# Patient Record
Sex: Male | Born: 1980
Health system: Southern US, Community
[De-identification: ages and names within clinical notes are randomized; demographics above are authoritative.]

## PROBLEM LIST (undated history)

## (undated) DIAGNOSIS — R042 Hemoptysis: Secondary | ICD-10-CM

---
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
## 2017-10-07 ENCOUNTER — Ambulatory Visit: Payer: BLUE CROSS/BLUE SHIELD | Admitting: Family Medicine

## 2017-10-07 ENCOUNTER — Encounter: Payer: Self-pay | Admitting: Family Medicine

## 2017-10-07 VITALS — BP 130/86 | HR 77 | Temp 99.1°F | Resp 16 | Ht 69.0 in | Wt 193.6 lb

## 2017-10-07 DIAGNOSIS — Z8719 Personal history of other diseases of the digestive system: Secondary | ICD-10-CM

## 2017-10-07 DIAGNOSIS — D224 Melanocytic nevi of scalp and neck: Secondary | ICD-10-CM | POA: Diagnosis not present

## 2017-10-07 DIAGNOSIS — J019 Acute sinusitis, unspecified: Secondary | ICD-10-CM

## 2017-10-07 DIAGNOSIS — Z23 Encounter for immunization: Secondary | ICD-10-CM

## 2017-10-07 MED ORDER — AMOXICILLIN-POT CLAVULANATE 875-125 MG PO TABS
1.0000 | ORAL_TABLET | Freq: Two times a day (BID) | ORAL | 0 refills | Status: DC
Start: 2017-10-07 — End: 2018-04-13

## 2017-10-07 NOTE — Progress Notes (Signed)
By signing my name below, I, Mayer Masker, attest that this documentation has been prepared under the direction and in the presence of Carlota Raspberry, Ranell Patrick, MD. Electronically Signed: Mayer Masker, Medical Scribe 10/07/2017 at 2:30 PM. Subjective:    Patient ID: Wesley Mcknight, male    DOB: 10-02-1980, 37 y.o.   MRN: [id number]  HPI Chief Complaint  Patient presents with  . new pt  . Lump    back of head on right side, x 2-3 months  . cold    "sitting in right side of face" pt states he feels like it has been going on x 4 months   Wesley Mcknight is a 37 y.o. male is a new pt to me who presents to Primary Care at [facility] complains of a bump on the R/back side of his head for 2-3 months.  It is not changing in size. He has tried to pick at it and scratch. It has not bled. He does not work outside often. He denies pain or drainage to the area.   Cold symptoms: Pt states he had a ruptured tooth on R side of his face, then had a sinus infection as a result. He had this tooth pulled, was on abx, and afterwards he states he has foul tasting and smelling mucus. He has had constant discolored nasal discharge (green in color) and congestion for 4-5 months. He has recently had a lot of dental work.  He denies fever, chills, facial pain, and unexplained wt loss. He denies new dental pain or cold sensitivity. Pt has NKDA to abx.   He has a FMHx of skin cancer (father).  Reflux: Pt has a h/o reflux but is able to manage this with diet and watching what he eats, he is not on meds at this time.    There are no active problems to display for this patient.  History reviewed. No pertinent past medical history. Past Surgical History:  Procedure Laterality Date  . PCL replacement     No Known Allergies Prior to Admission medications   Medication Sig Start Date End Date Taking? Authorizing Provider  amoxicillin-clavulanate (AUGMENTIN) 875-125 MG tablet Take 1 tablet by mouth 2 (two) times  daily. 10/07/17   Wendie Agreste, MD   Social History   Socioeconomic History  . Marital status: Single    Spouse name: Not on file  . Number of children: Not on file  . Years of education: Not on file  . Highest education level: Not on file  Social Needs  . Financial resource strain: Not on file  . Food insecurity - worry: Not on file  . Food insecurity - inability: Not on file  . Transportation needs - medical: Not on file  . Transportation needs - non-medical: Not on file  Occupational History  . Not on file  Tobacco Use  . Smoking status: Never Smoker  . Smokeless tobacco: Never Used  Substance and Sexual Activity  . Alcohol use: Yes    Alcohol/week: 1.8 oz    Types: 3 Standard drinks or equivalent per week  . Drug use: No  . Sexual activity: Not on file  Other Topics Concern  . Not on file  Social History Narrative  . Not on file   Vitals:   10/07/17 1431  BP: 130/86  Pulse: 77  Resp: 16  Temp: 99.1 F (37.3 C)  TempSrc: Oral  SpO2: 97%  Weight: 193 lb 9.6 oz (87.8 kg)  Height: 5\' 9"  (  1.753 m)    Review of Systems  Constitutional: Negative for chills, fever and unexpected weight change.  HENT: Positive for congestion and postnasal drip. Negative for dental problem.   Endocrine: Negative for cold intolerance.  Skin:       Negative for: pain, bleeding, and drainage to the skin      Objective:   Physical Exam  Constitutional: He is oriented to person, place, and time. He appears well-developed and well-nourished.  HENT:  Head: Normocephalic and atraumatic.  Right Ear: Tympanic membrane, external ear and ear canal normal.  Left Ear: Tympanic membrane, external ear and ear canal normal.  Nose: No rhinorrhea.  Mouth/Throat: Oropharynx is clear and moist and mucous membranes are normal. No oropharyngeal exudate or posterior oropharyngeal erythema.  Scalp: Flesh colored lesion at the R occipital-parietal area of the scalp approx 4 mm. It is slightly  elevated, slight waxy appearance.   Mouth: No gum erythema, no apparent abscess, no intraloral lesions. No frontal or maxillary sinus TTP. Minimal edema of turbinates of the nose  Eyes: Conjunctivae are normal. Pupils are equal, round, and reactive to light.  Neck: Neck supple.  Cardiovascular: Normal rate, regular rhythm, normal heart sounds and intact distal pulses.  No murmur heard. Pulmonary/Chest: Effort normal and breath sounds normal. He has no wheezes. He has no rhonchi. He has no rales.  Abdominal: Soft. There is no tenderness.  Lymphadenopathy:    He has no cervical adenopathy.  Neurological: He is alert and oriented to person, place, and time.  Skin: Skin is warm and dry. No rash noted.  Psychiatric: He has a normal mood and affect. His behavior is normal.  Vitals reviewed.     Assessment & Plan:    Wesley Mcknight is a 37 y.o. male Subacute sinusitis, unspecified location - Plan: amoxicillin-clavulanate (AUGMENTIN) 875-125 MG tablet  - Suspected right-sided sinusitis. Could be ethmoid/sphenoid, subacute.  -Augmentin for 10 days, saline nasal spray or an idiopathic, then if not improving in 2 weeks, CT sinuses.  Nevus of scalp - Plan: Ambulatory referral to Dermatology  - Slight waxy appearance, differential includes basal cell versus squamous cell versus benign nevus. Refer to dermatology for evaluation and likely removal  Need for Tdap vaccination - Plan: Tdap vaccine greater than or equal to 7yo IM  Needs flu shot - Plan: Flu Vaccine QUAD 36+ mos IM  History of esophageal reflux  - Trigger avoidance discussed, handout given. notes in the past his symptoms were worse with greasy food or spicy food to where he felt food would try to come back up. Denies recent symptoms.  Meds ordered this encounter  Medications  . amoxicillin-clavulanate (AUGMENTIN) 875-125 MG tablet    Sig: Take 1 tablet by mouth 2 (two) times daily.    Dispense:  20 tablet    Refill:  0    Patient Instructions   Start augmentin for possible sinus infection.  If not improving in next 2 weeks, would recommend CT scan of sinuses. Return to the clinic or go to the nearest emergency room if any of your symptoms worsen or new symptoms occur.  I will refer you to dermatology for the area on your scalp. Let me know if there are questions prior to that time.   If any worsening reflux symptoms, or difficulty with food swallowing,  please follow-up to discuss further.   Sinusitis, Adult Sinusitis is soreness and inflammation of your sinuses. Sinuses are hollow spaces in the bones around your face. Your  sinuses are located:  Around your eyes.  In the middle of your forehead.  Behind your nose.  In your cheekbones.  Your sinuses and nasal passages are lined with a stringy fluid (mucus). Mucus normally drains out of your sinuses. When your nasal tissues become inflamed or swollen, the mucus can become trapped or blocked so air cannot flow through your sinuses. This allows bacteria, viruses, and funguses to grow, which leads to infection. Sinusitis can develop quickly and last for 7?10 days (acute) or for more than 12 weeks (chronic). Sinusitis often develops after a cold. What are the causes? This condition is caused by anything that creates swelling in the sinuses or stops mucus from draining, including:  Allergies.  Asthma.  Bacterial or viral infection.  Abnormally shaped bones between the nasal passages.  Nasal growths that contain mucus (nasal polyps).  Narrow sinus openings.  Pollutants, such as chemicals or irritants in the air.  A foreign object stuck in the nose.  A fungal infection. This is rare.  What increases the risk? The following factors may make you more likely to develop this condition:  Having allergies or asthma.  Having had a recent cold or respiratory tract infection.  Having structural deformities or blockages in your nose or  sinuses.  Having a weak immune system.  Doing a lot of swimming or diving.  Overusing nasal sprays.  Smoking.  What are the signs or symptoms? The main symptoms of this condition are pain and a feeling of pressure around the affected sinuses. Other symptoms include:  Upper toothache.  Earache.  Headache.  Bad breath.  Decreased sense of smell and taste.  A cough that may get worse at night.  Fatigue.  Fever.  Thick drainage from your nose. The drainage is often green and it may contain pus (purulent).  Stuffy nose or congestion.  Postnasal drip. This is when extra mucus collects in the throat or back of the nose.  Swelling and warmth over the affected sinuses.  Sore throat.  Sensitivity to light.  How is this diagnosed? This condition is diagnosed based on symptoms, a medical history, and a physical exam. To find out if your condition is acute or chronic, your health care provider may:  Look in your nose for signs of nasal polyps.  Tap over the affected sinus to check for signs of infection.  View the inside of your sinuses using an imaging device that has a light attached (endoscope).  If your health care provider suspects that you have chronic sinusitis, you may also:  Be tested for allergies.  Have a sample of mucus taken from your nose (nasal culture) and checked for bacteria.  Have a mucus sample examined to see if your sinusitis is related to an allergy.  If your sinusitis does not respond to treatment and it lasts longer than 8 weeks, you may have an MRI or CT scan to check your sinuses. These scans also help to determine how severe your infection is. In rare cases, a bone biopsy may be done to rule out more serious types of fungal sinus disease. How is this treated? Treatment for sinusitis depends on the cause and whether your condition is chronic or acute. If a virus is causing your sinusitis, your symptoms will go away on their own within 10  days. You may be given medicines to relieve your symptoms, including:  Topical nasal decongestants. They shrink swollen nasal passages and let mucus drain from your sinuses.  Antihistamines. These  drugs block inflammation that is triggered by allergies. This can help to ease swelling in your nose and sinuses.  Topical nasal corticosteroids. These are nasal sprays that ease inflammation and swelling in your nose and sinuses.  Nasal saline washes. These rinses can help to get rid of thick mucus in your nose.  If your condition is caused by bacteria, you will be given an antibiotic medicine. If your condition is caused by a fungus, you will be given an antifungal medicine. Surgery may be needed to correct underlying conditions, such as narrow nasal passages. Surgery may also be needed to remove polyps. Follow these instructions at home: Medicines  Take, use, or apply over-the-counter and prescription medicines only as told by your health care provider. These may include nasal sprays.  If you were prescribed an antibiotic medicine, take it as told by your health care provider. Do not stop taking the antibiotic even if you start to feel better. Hydrate and Humidify  Drink enough water to keep your urine clear or pale yellow. Staying hydrated will help to thin your mucus.  Use a cool mist humidifier to keep the humidity level in your home above 50%.  Inhale steam for 10-15 minutes, 3-4 times a day or as told by your health care provider. You can do this in the bathroom while a hot shower is running.  Limit your exposure to cool or dry air. Rest  Rest as much as possible.  Sleep with your head raised (elevated).  Make sure to get enough sleep each night. General instructions  Apply a warm, moist washcloth to your face 3-4 times a day or as told by your health care provider. This will help with discomfort.  Wash your hands often with soap and water to reduce your exposure to viruses and  other germs. If soap and water are not available, use hand sanitizer.  Do not smoke. Avoid being around people who are smoking (secondhand smoke).  Keep all follow-up visits as told by your health care provider. This is important. Contact a health care provider if:  You have a fever.  Your symptoms get worse.  Your symptoms do not improve within 10 days. Get help right away if:  You have a severe headache.  You have persistent vomiting.  You have pain or swelling around your face or eyes.  You have vision problems.  You develop confusion.  Your neck is stiff.  You have trouble breathing. This information is not intended to replace advice given to you by your health care provider. Make sure you discuss any questions you have with your health care provider. Document Released: 08/02/2005 Document Revised: 03/28/2016 Document Reviewed: 05/28/2015 Elsevier Interactive Patient Education  2018 [place] for Gastroesophageal Reflux Disease, Adult When you have gastroesophageal reflux disease (GERD), the foods you eat and your eating habits are very important. Choosing the right foods can help ease your discomfort. What guidelines do I need to follow?  Choose fruits, vegetables, whole grains, and low-fat dairy products.  Choose low-fat meat, fish, and poultry.  Limit fats such as oils, salad dressings, butter, nuts, and avocado.  Keep a food diary. This helps you identify foods that cause symptoms.  Avoid foods that cause symptoms. These may be different for everyone.  Eat small meals often instead of 3 large meals a day.  Eat your meals slowly, in a place where you are relaxed.  Limit fried foods.  Cook foods using methods other than frying.  Avoid drinking alcohol.  Avoid drinking large amounts of liquids with your meals.  Avoid bending over or lying down until 2-3 hours after eating. What foods are not recommended? These are some foods and  drinks that may make your symptoms worse: Vegetables Tomatoes. Tomato juice. Tomato and spaghetti sauce. Chili peppers. Onion and garlic. Horseradish. Fruits Oranges, grapefruit, and lemon (fruit and juice). Meats High-fat meats, fish, and poultry. This includes hot dogs, ribs, ham, sausage, salami, and bacon. Dairy Whole milk and chocolate milk. Sour cream. Cream. Butter. Ice cream. Cream cheese. Drinks Coffee and tea. Bubbly (carbonated) drinks or energy drinks. Condiments Hot sauce. Barbecue sauce. Sweets/Desserts Chocolate and cocoa. Donuts. Peppermint and spearmint. Fats and Oils High-fat foods. This includes [place] fries and potato chips. Other Vinegar. Strong spices. This includes black pepper, white pepper, red pepper, cayenne, curry powder, cloves, ginger, and chili powder. The items listed above may not be a complete list of foods and drinks to avoid. Contact your dietitian for more information. This information is not intended to replace advice given to you by your health care provider. Make sure you discuss any questions you have with your health care provider. Document Released: 02/01/2012 Document Revised: 01/08/2016 Document Reviewed: 06/06/2013 Elsevier Interactive Patient Education  2017 [employer].   IF you received an x-ray today, you will receive an invoice from [facility]. Please contact [facility] at [phone] with questions or concerns regarding your invoice.   IF you received labwork today, you will receive an invoice from [facility]. Please contact [facility] at [phone] with questions or concerns regarding your invoice.   Our billing staff will not be able to assist you with questions regarding bills from these companies.  You will be contacted with the lab results as soon as they are available. The fastest way to get your results is to activate your My Chart account. Instructions are located on the last page of this paperwork. If you  have not heard from [place] regarding the results in 2 weeks, please contact this office.      I personally performed the services described in this documentation, which was scribed in my presence. The recorded information has been reviewed and considered for accuracy and completeness, addended by me as needed, and agree with information above.  Signed,   Merri Ray, MD [facility] at [place].  10/07/17 3:35 PM

## 2017-10-07 NOTE — Patient Instructions (Addendum)
Start augmentin for possible sinus infection.  If not improving in next 2 weeks, would recommend CT scan of sinuses. Return to the clinic or go to the nearest emergency room if any of your symptoms worsen or new symptoms occur.  I will refer you to dermatology for the area on your scalp. Let me know if there are questions prior to that time.   If any worsening reflux symptoms, or difficulty with food swallowing,  please follow-up to discuss further.   Sinusitis, Adult Sinusitis is soreness and inflammation of your sinuses. Sinuses are hollow spaces in the bones around your face. Your sinuses are located:  Around your eyes.  In the middle of your forehead.  Behind your nose.  In your cheekbones.  Your sinuses and nasal passages are lined with a stringy fluid (mucus). Mucus normally drains out of your sinuses. When your nasal tissues become inflamed or swollen, the mucus can become trapped or blocked so air cannot flow through your sinuses. This allows bacteria, viruses, and funguses to grow, which leads to infection. Sinusitis can develop quickly and last for 7?10 days (acute) or for more than 12 weeks (chronic). Sinusitis often develops after a cold. What are the causes? This condition is caused by anything that creates swelling in the sinuses or stops mucus from draining, including:  Allergies.  Asthma.  Bacterial or viral infection.  Abnormally shaped bones between the nasal passages.  Nasal growths that contain mucus (nasal polyps).  Narrow sinus openings.  Pollutants, such as chemicals or irritants in the air.  A foreign object stuck in the nose.  A fungal infection. This is rare.  What increases the risk? The following factors may make you more likely to develop this condition:  Having allergies or asthma.  Having had a recent cold or respiratory tract infection.  Having structural deformities or blockages in your nose or sinuses.  Having a weak immune  system.  Doing a lot of swimming or diving.  Overusing nasal sprays.  Smoking.  What are the signs or symptoms? The main symptoms of this condition are pain and a feeling of pressure around the affected sinuses. Other symptoms include:  Upper toothache.  Earache.  Headache.  Bad breath.  Decreased sense of smell and taste.  A cough that may get worse at night.  Fatigue.  Fever.  Thick drainage from your nose. The drainage is often green and it may contain pus (purulent).  Stuffy nose or congestion.  Postnasal drip. This is when extra mucus collects in the throat or back of the nose.  Swelling and warmth over the affected sinuses.  Sore throat.  Sensitivity to light.  How is this diagnosed? This condition is diagnosed based on symptoms, a medical history, and a physical exam. To find out if your condition is acute or chronic, your health care provider may:  Look in your nose for signs of nasal polyps.  Tap over the affected sinus to check for signs of infection.  View the inside of your sinuses using an imaging device that has a light attached (endoscope).  If your health care provider suspects that you have chronic sinusitis, you may also:  Be tested for allergies.  Have a sample of mucus taken from your nose (nasal culture) and checked for bacteria.  Have a mucus sample examined to see if your sinusitis is related to an allergy.  If your sinusitis does not respond to treatment and it lasts longer than 8 weeks, you may have  an MRI or CT scan to check your sinuses. These scans also help to determine how severe your infection is. In rare cases, a bone biopsy may be done to rule out more serious types of fungal sinus disease. How is this treated? Treatment for sinusitis depends on the cause and whether your condition is chronic or acute. If a virus is causing your sinusitis, your symptoms will go away on their own within 10 days. You may be given medicines to  relieve your symptoms, including:  Topical nasal decongestants. They shrink swollen nasal passages and let mucus drain from your sinuses.  Antihistamines. These drugs block inflammation that is triggered by allergies. This can help to ease swelling in your nose and sinuses.  Topical nasal corticosteroids. These are nasal sprays that ease inflammation and swelling in your nose and sinuses.  Nasal saline washes. These rinses can help to get rid of thick mucus in your nose.  If your condition is caused by bacteria, you will be given an antibiotic medicine. If your condition is caused by a fungus, you will be given an antifungal medicine. Surgery may be needed to correct underlying conditions, such as narrow nasal passages. Surgery may also be needed to remove polyps. Follow these instructions at home: Medicines  Take, use, or apply over-the-counter and prescription medicines only as told by your health care provider. These may include nasal sprays.  If you were prescribed an antibiotic medicine, take it as told by your health care provider. Do not stop taking the antibiotic even if you start to feel better. Hydrate and Humidify  Drink enough water to keep your urine clear or pale yellow. Staying hydrated will help to thin your mucus.  Use a cool mist humidifier to keep the humidity level in your home above 50%.  Inhale steam for 10-15 minutes, 3-4 times a day or as told by your health care provider. You can do this in the bathroom while a hot shower is running.  Limit your exposure to cool or dry air. Rest  Rest as much as possible.  Sleep with your head raised (elevated).  Make sure to get enough sleep each night. General instructions  Apply a warm, moist washcloth to your face 3-4 times a day or as told by your health care provider. This will help with discomfort.  Wash your hands often with soap and water to reduce your exposure to viruses and other germs. If soap and water are  not available, use hand sanitizer.  Do not smoke. Avoid being around people who are smoking (secondhand smoke).  Keep all follow-up visits as told by your health care provider. This is important. Contact a health care provider if:  You have a fever.  Your symptoms get worse.  Your symptoms do not improve within 10 days. Get help right away if:  You have a severe headache.  You have persistent vomiting.  You have pain or swelling around your face or eyes.  You have vision problems.  You develop confusion.  Your neck is stiff.  You have trouble breathing. This information is not intended to replace advice given to you by your health care provider. Make sure you discuss any questions you have with your health care provider. Document Released: 08/02/2005 Document Revised: 03/28/2016 Document Reviewed: 05/28/2015 Elsevier Interactive Patient Education  2018 [place] for Gastroesophageal Reflux Disease, Adult When you have gastroesophageal reflux disease (GERD), the foods you eat and your eating habits are very important.  Choosing the right foods can help ease your discomfort. What guidelines do I need to follow?  Choose fruits, vegetables, whole grains, and low-fat dairy products.  Choose low-fat meat, fish, and poultry.  Limit fats such as oils, salad dressings, butter, nuts, and avocado.  Keep a food diary. This helps you identify foods that cause symptoms.  Avoid foods that cause symptoms. These may be different for everyone.  Eat small meals often instead of 3 large meals a day.  Eat your meals slowly, in a place where you are relaxed.  Limit fried foods.  Cook foods using methods other than frying.  Avoid drinking alcohol.  Avoid drinking large amounts of liquids with your meals.  Avoid bending over or lying down until 2-3 hours after eating. What foods are not recommended? These are some foods and drinks that may make your symptoms  worse: Vegetables Tomatoes. Tomato juice. Tomato and spaghetti sauce. Chili peppers. Onion and garlic. Horseradish. Fruits Oranges, grapefruit, and lemon (fruit and juice). Meats High-fat meats, fish, and poultry. This includes hot dogs, ribs, ham, sausage, salami, and bacon. Dairy Whole milk and chocolate milk. Sour cream. Cream. Butter. Ice cream. Cream cheese. Drinks Coffee and tea. Bubbly (carbonated) drinks or energy drinks. Condiments Hot sauce. Barbecue sauce. Sweets/Desserts Chocolate and cocoa. Donuts. Peppermint and spearmint. Fats and Oils High-fat foods. This includes [place] fries and potato chips. Other Vinegar. Strong spices. This includes black pepper, white pepper, red pepper, cayenne, curry powder, cloves, ginger, and chili powder. The items listed above may not be a complete list of foods and drinks to avoid. Contact your dietitian for more information. This information is not intended to replace advice given to you by your health care provider. Make sure you discuss any questions you have with your health care provider. Document Released: 02/01/2012 Document Revised: 01/08/2016 Document Reviewed: 06/06/2013 Elsevier Interactive Patient Education  2017 [employer].   IF you received an x-ray today, you will receive an invoice from [facility]. Please contact [facility] at [phone] with questions or concerns regarding your invoice.   IF you received labwork today, you will receive an invoice from [facility]. Please contact [facility] at [phone] with questions or concerns regarding your invoice.   Our billing staff will not be able to assist you with questions regarding bills from these companies.  You will be contacted with the lab results as soon as they are available. The fastest way to get your results is to activate your My Chart account. Instructions are located on the last page of this paperwork. If you have not heard from [place] regarding  the results in 2 weeks, please contact this office.

## 2018-03-05 ENCOUNTER — Encounter (HOSPITAL_COMMUNITY): Payer: Self-pay | Admitting: *Deleted

## 2018-03-05 ENCOUNTER — Emergency Department (HOSPITAL_COMMUNITY): Payer: BLUE CROSS/BLUE SHIELD

## 2018-03-05 ENCOUNTER — Other Ambulatory Visit: Payer: Self-pay

## 2018-03-05 ENCOUNTER — Emergency Department (HOSPITAL_COMMUNITY)
Admission: EM | Admit: 2018-03-05 | Discharge: 2018-03-05 | Disposition: A | Discharge status: Home / Self Care | Payer: BLUE CROSS/BLUE SHIELD | Attending: Emergency Medicine | Admitting: Emergency Medicine

## 2018-03-05 DIAGNOSIS — R079 Chest pain, unspecified: Secondary | ICD-10-CM

## 2018-03-05 DIAGNOSIS — Z79899 Other long term (current) drug therapy: Secondary | ICD-10-CM | POA: Insufficient documentation

## 2018-03-05 DIAGNOSIS — Z8719 Personal history of other diseases of the digestive system: Secondary | ICD-10-CM | POA: Insufficient documentation

## 2018-03-05 DIAGNOSIS — R0789 Other chest pain: Secondary | ICD-10-CM | POA: Diagnosis present

## 2018-03-05 DIAGNOSIS — R05 Cough: Secondary | ICD-10-CM | POA: Insufficient documentation

## 2018-03-05 DIAGNOSIS — R0602 Shortness of breath: Secondary | ICD-10-CM | POA: Diagnosis not present

## 2018-03-05 DIAGNOSIS — R059 Cough, unspecified: Secondary | ICD-10-CM

## 2018-03-05 LAB — BASIC METABOLIC PANEL
Anion gap: 9 (ref 5–15)
BUN: 15 mg/dL (ref 6–20)
CALCIUM: 9.4 mg/dL (ref 8.9–10.3)
CO2: 24 mmol/L (ref 22–32)
Chloride: 109 mmol/L (ref 98–111)
Creatinine, Ser: 0.85 mg/dL (ref 0.61–1.24)
GFR calc Af Amer: >60 mL/min (ref >60)
GFR calc non Af Amer: >60 mL/min (ref >60)
GLUCOSE: 94 mg/dL (ref 70–99)
Potassium: 3.5 mmol/L (ref 3.5–5.1)
Sodium: 142 mmol/L (ref 135–145)

## 2018-03-05 LAB — I-STAT TROPONIN, ED: TROPONIN I, POC: 0 ng/mL (ref 0.00–0.08)

## 2018-03-05 LAB — D-DIMER, QUANTITATIVE: D-Dimer, Quant: <0.27 ug/mL-FEU (ref 0.00–0.50)

## 2018-03-05 LAB — CBC
HCT: 40.3 % (ref 39.0–52.0)
HEMOGLOBIN: 14.3 g/dL (ref 13.0–17.0)
MCH: 31 pg (ref 26.0–34.0)
MCHC: 35.5 g/dL (ref 30.0–36.0)
MCV: 87.4 fL (ref 78.0–100.0)
Platelets: 241 10*3/uL (ref 150–400)
RBC: 4.61 MIL/uL (ref 4.22–5.81)
RDW: 12 % (ref 11.5–15.5)
WBC: 6.7 10*3/uL (ref 4.0–10.5)

## 2018-03-05 MED ORDER — GI COCKTAIL ~~LOC~~
30.0000 mL | Freq: Once | ORAL | Status: AC
  Administered 2018-03-05: 30 mL via ORAL
  Filled 2018-03-05: qty 30

## 2018-03-05 MED ORDER — OMEPRAZOLE 20 MG PO CPDR: 20.0000 mg | DELAYED_RELEASE_CAPSULE | Freq: Every day | ORAL | 1 refills | Status: DC

## 2018-03-05 NOTE — ED Triage Notes (Signed)
Pt arrives with c/o several days of dull chest pain that started to get worse last night. He also reports some dizziness, shortness of breath and coughing up blood.

## 2018-03-05 NOTE — ED Notes (Signed)
Patient transported to X-ray 

## 2018-03-05 NOTE — ED Provider Notes (Signed)
[facility] DEPT Provider Note   CSN: [id number] Arrival date & time: 03/05/18  5993     History   Chief Complaint Chief Complaint  Patient presents with  . Chest Pain    HPI Wesley Mcknight is a 37 y.o. male who presents emergency department today for chest pain.  Patient reports that few weeks ago he had a sinus infection that preceded his symptoms.  He notes that 2 weeks ago he started developing some mild shortness of breath.  He notes that over time this is worsened and now occurs at rest and also with exertion.  He states over the last 2 days he has been having a substernal, dull, achy, pressing chest pain that is constant.  He reports that the pain is pleuritic in nature and worse with deep breaths. The pain is not exertional or positional in nature.  He reports that yesterday he began having a cough with associated blood-streaked sputum x 1.  He denies clots or gross hemoptysis.  No nosebleeds.  He denies any abdominal pain or hematemesis.  Patient is on blood thinners.  He states that he has not had any hemoptysis today. Cough is now productive with clear/white sputum. Patient reports he took 1 baby aspirin a few days ago without any relief.  No other interventions prior to arrival.  He denies history of similar symptoms in the past. Does report history of GERD, and has had some burping and belching associated with this that provides some relief of his symptoms. He notes his nasal congestion and sinus pressure is resolved.  Patient denies any family history of early CAD.  He notes he is a never smoker.  No prior history of MI, CVA or TIA.  He has never had PCI/CABG, echocardiogram or stress testing in the past.  Patient denies any cocaine or other illicit drug use.  He is not on any daily medications.  He does note he drinks approximately 1-3 alcoholic beverages per week.  Patient denies any fever at home. Denies exogenous estrogen/testosterone use, recent  surgery or travel, trauma, immobilization, smoking, previous blood clot,  personal history of cancer, lower extremity pain or swelling, or family/personal history of bleeding/clotting disorder.   HPI  History reviewed. No pertinent past medical history.  There are no active problems to display for this patient.   Past Surgical History:  Procedure Laterality Date  . PCL replacement          Home Medications    Prior to Admission medications   Medication Sig Start Date End Date Taking? Authorizing Provider  amoxicillin-clavulanate (AUGMENTIN) 875-125 MG tablet Take 1 tablet by mouth 2 (two) times daily. 10/07/17   Wendie Agreste, MD    Family History Family History  Problem Relation Age of Onset  . Diabetes Mother     Social History Social History   Tobacco Use  . Smoking status: Never Smoker  . Smokeless tobacco: Never Used  Substance Use Topics  . Alcohol use: Yes    Alcohol/week: 1.8 oz    Types: 3 Standard drinks or equivalent per week  . Drug use: No     Allergies   Patient has no known allergies.   Review of Systems Review of Systems  All other systems reviewed and are negative.    Physical Exam Updated Vital Signs BP 112/79   Pulse 72   Temp 98.1 F (36.7 C) (Oral)   Resp 10   Ht 5\' 9"  (1.753 m)  Wt 89.7 kg (197 lb 12.8 oz)   SpO2 97%   BMI 29.21 kg/m   Physical Exam  Constitutional: He appears well-developed and well-nourished.  HENT:  Head: Normocephalic and atraumatic.  Right Ear: External ear normal.  Left Ear: External ear normal.  Nose: Nose normal. No epistaxis. Right sinus exhibits no maxillary sinus tenderness and no frontal sinus tenderness. Left sinus exhibits no maxillary sinus tenderness and no frontal sinus tenderness.  Mouth/Throat: Uvula is midline, oropharynx is clear and moist and mucous membranes are normal. No tonsillar exudate.  The patient has normal phonation and is in control of secretions. No stridor.  Midline  uvula without edema. Soft palate rises symmetrically.  No tonsillar erythema or exudates. No PTA. Tongue protrusion is normal. No trismus. No creptius on neck palpation and patient has good dentition. No gingival erythema or fluctuance noted. Mucus membranes moist.  Eyes: Pupils are equal, round, and reactive to light. Right eye exhibits no discharge. Left eye exhibits no discharge. No scleral icterus.  Neck: Trachea normal. Neck supple. No JVD present. No spinous process tenderness present. Carotid bruit is not present. No neck rigidity. Normal range of motion present.  No nuchal rigidity or meningismus  Cardiovascular: Normal rate, regular rhythm and intact distal pulses.  No murmur heard. Pulses:      Radial pulses are 2+ on the right side, and 2+ on the left side.       Dorsalis pedis pulses are 2+ on the right side, and 2+ on the left side.       Posterior tibial pulses are 2+ on the right side, and 2+ on the left side.  No lower extremity swelling or edema. Calves symmetric in size bilaterally.  Pulmonary/Chest: Effort normal and breath sounds normal. He exhibits no tenderness.  Patient satting at 97% on room air. No increased work of breathing. No accessory muscle use. Patient is sitting upright, speaking in full sentences without difficulty   Abdominal: Soft. Bowel sounds are normal. There is no tenderness. There is no rebound and no guarding.  Musculoskeletal: He exhibits no edema.  Lymphadenopathy:    He has no cervical adenopathy.  Neurological: He is alert.  Skin: Skin is warm and dry. No petechiae, no purpura and no rash noted. He is not diaphoretic.  Psychiatric: He has a normal mood and affect.  Nursing note and vitals reviewed.    ED Treatments / Results  Labs (all labs ordered are listed, but only abnormal results are displayed) Labs Reviewed  BASIC METABOLIC PANEL  CBC  D-DIMER, QUANTITATIVE (NOT AT [facility])  I-STAT TROPONIN, ED    EKG EKG  Interpretation  Date/Time:  Sunday March 05 2018 06:47:16 EDT Ventricular Rate:  78 PR Interval:    QRS Duration: 87 QT Interval:  366 QTC Calculation: 417 R Axis:   61 Text Interpretation:  Sinus rhythm No previous ECGs available Interpretation limited secondary to artifact Confirmed by Ripley Fraise [phone]) on 03/05/2018 6:58:39 AM   Radiology Dg Chest 2 View  Result Date: 03/05/2018 CLINICAL DATA:  Chest pain and shortness of breath EXAM: CHEST - 2 VIEW COMPARISON:  None. FINDINGS: The heart size and mediastinal contours are within normal limits. Both lungs are clear. The visualized skeletal structures are unremarkable. IMPRESSION: No active cardiopulmonary disease. Electronically Signed   By: Jerilynn Mages.  Shick M.D.   On: 03/05/2018 08:28    Procedures Procedures (including critical care time)  Medications Ordered in ED Medications  gi cocktail (Maalox,Lidocaine,Donnatal) (30 mLs Oral Given  03/05/18 0845)     Initial Impression / Assessment and Plan / ED Course  I have reviewed the triage vital signs and the nursing notes.  Pertinent labs & imaging results that were available during my care of the patient were reviewed by me and considered in my medical decision making (see chart for details).     37 y.o. male presenting today for shortness of breath, substernal chest pressure, burping/belching, cough and one episode of blood-streaked sputum.  Patient is to be discharged with recommendation to follow up with PCP in regards to today's hospital visit. Chest pain is not likely of cardiac or pulmonary etiology due to presentation, negative D-Dimer (patient without tachycardia or hypoxia), stable vital signs, no tracheal deviation, no JVD or new murmur, RRR, breath sounds equal bilaterally, EKG without acute abnormalities, negative troponin, and negative CXR. HEART score is low risk. Patient has been advised to return to the ED if chest pain becomes exertional, associated with diaphoresis  or nausea, radiates to left jaw/arm, worsens or becomes concerning in any way. He is also to return if he develop additional episodes of hemoptysis. There is no associated anemia. Patient was given GI cocktail in department with full relief of symptoms. Patient has history of GERD and is not on anything for this. Will start on omeprazole. Patient appears reliable for follow up and is agreeable to discharge. I advised the patient to follow-up with their primary care provider this week. I advised the patient to return to the emergency department with new or worsening symptoms or new concerns. The patient verbalized understanding and agreement with plan. Patient stable for discharge.   Final Clinical Impressions(s) / ED Diagnoses   Final diagnoses:  Nonspecific chest pain  History of gastroesophageal reflux (GERD)  Shortness of breath  Cough    ED Discharge Orders        Ordered    omeprazole (PRILOSEC) 20 MG capsule  Daily     03/05/18 0937       Jillyn Ledger, PA-C 03/05/18 0941    [name], MD 03/06/18 2024

## 2018-03-05 NOTE — Discharge Instructions (Signed)
Read instructions below for reasons to return to the Emergency Department. It is recommended that your follow up with your Primary Care Doctor in regards to today's visit. If you do not have a doctor, use the resource guide listed below to help you find one.  Begin taking over the counter Prilosec or Zegrid (omeprazole) as directed.   Tests performed today include: An EKG of your heart A chest x-ray Cardiac enzymes - a blood test for heart muscle damage Blood counts and electrolytes D-Dimer. This was within normal limits as we discussed. Vital signs. See below for your results today.   Chest Pain (Nonspecific)  HOME CARE INSTRUCTIONS  For the next few days, avoid physical activities that bring on chest pain. Continue physical activities as directed.  Do not smoke cigarettes or drink alcohol until your symptoms are gone. If you do smoke, it is time to quit. You may receive instructions and counseling on how to stop smoking. Only take over-the-counter or prescription medicine for pain, discomfort, or fever as directed by your caregiver.  Follow your caregiver's suggestions for further testing if your chest pain does not go away.  Keep any follow-up appointments you made. If you do not go to an appointment, you could develop lasting (chronic) problems with pain. If there is any problem keeping an appointment, you must call to reschedule.  SEEK MEDICAL CARE IF:  You think you are having problems from the medicine you are taking. Read your medicine instructions carefully.  Your chest pain does not go away, even after treatment.  You develop a rash with blisters on your chest.  SEEK IMMEDIATE MEDICAL CARE IF:  You have increased chest pain or pain that spreads to your arm, neck, jaw, back, or belly (abdomen).  You develop shortness of breath, an increasing cough, or you are coughing up blood.  You have severe back or abdominal pain, feel sick to your stomach (nauseous) or throw up (vomit).  You  develop severe weakness, fainting, or chills.  You have an oral temperature above 102 F (38.9 C), not controlled by medicine.  THIS IS AN EMERGENCY. Do not wait to see if the pain will go away. Get medical help at once. Call your local emergency services (911 in [place] Do not drive yourself to the hospital. Additional Information:  Your vital signs today were: BP 123/78 (BP Location: Left Arm) Comment: Simultaneous filing. User may not have seen previous data.   Pulse (!) 58 Comment: Simultaneous filing. User may not have seen previous data.   Temp 98.1 F (36.7 C) (Oral)    Resp 10 Comment: Simultaneous filing. User may not have seen previous data.   Ht 5\' 9"  (1.753 m)    Wt 89.7 kg (197 lb 12.8 oz)    SpO2 94% Comment: Simultaneous filing. User may not have seen previous data.   BMI 29.21 kg/m  If your blood pressure (BP) was elevated above 135/85 this visit, please have this repeated by your doctor within one month. ---------------

## 2018-03-17 ENCOUNTER — Ambulatory Visit: Payer: BLUE CROSS/BLUE SHIELD | Admitting: Physician Assistant

## 2018-03-17 ENCOUNTER — Encounter: Payer: Self-pay | Admitting: Physician Assistant

## 2018-03-17 ENCOUNTER — Other Ambulatory Visit: Payer: Self-pay

## 2018-03-17 VITALS — BP 128/72 | HR 76 | Temp 98.6°F | Resp 16 | Ht 69.0 in | Wt 194.6 lb

## 2018-03-17 DIAGNOSIS — K219 Gastro-esophageal reflux disease without esophagitis: Secondary | ICD-10-CM | POA: Diagnosis not present

## 2018-03-17 DIAGNOSIS — Z09 Encounter for follow-up examination after completed treatment for conditions other than malignant neoplasm: Secondary | ICD-10-CM

## 2018-03-17 NOTE — Patient Instructions (Addendum)
  You will receive a phone call to schedule an appointment with GI (in 1-2 weeks).  If needed, you can take Ranitidine AKA Zantac (over the counter) 5min before bed if needed.    Food Choices for Gastroesophageal Reflux Disease, Adult When you have gastroesophageal reflux disease (GERD), the foods you eat and your eating habits are very important. Choosing the right foods can help ease your discomfort. What guidelines do I need to follow?  Choose fruits, vegetables, whole grains, and low-fat dairy products.  Choose low-fat meat, fish, and poultry.  Limit fats such as oils, salad dressings, butter, nuts, and avocado.  Keep a food diary. This helps you identify foods that cause symptoms.  Avoid foods that cause symptoms. These may be different for everyone.  Eat small meals often instead of 3 large meals a day.  Eat your meals slowly, in a place where you are relaxed.  Limit fried foods.  Cook foods using methods other than frying.  Avoid drinking alcohol.  Avoid drinking large amounts of liquids with your meals.  Avoid bending over or lying down until 2-3 hours after eating. What foods are not recommended? These are some foods and drinks that may make your symptoms worse: Vegetables Tomatoes. Tomato juice. Tomato and spaghetti sauce. Chili peppers. Onion and garlic. Horseradish. Fruits Oranges, grapefruit, and lemon (fruit and juice). Meats High-fat meats, fish, and poultry. This includes hot dogs, ribs, ham, sausage, salami, and bacon. Dairy Whole milk and chocolate milk. Sour cream. Cream. Butter. Ice cream. Cream cheese. Drinks Coffee and tea. Bubbly (carbonated) drinks or energy drinks. Condiments Hot sauce. Barbecue sauce. Sweets/Desserts Chocolate and cocoa. Donuts. Peppermint and spearmint. Fats and Oils High-fat foods. This includes [place] fries and potato chips. Other Vinegar. Strong spices. This includes black pepper, white pepper, red pepper, cayenne,  curry powder, cloves, ginger, and chili powder. The items listed above may not be a complete list of foods and drinks to avoid. Contact your dietitian for more information. This information is not intended to replace advice given to you by your health care provider. Make sure you discuss any questions you have with your health care provider. Document Released: 02/01/2012 Document Revised: 01/08/2016 Document Reviewed: 06/06/2013 Elsevier Interactive Patient Education  2017 [employer].  IF you received an x-ray today, you will receive an invoice from [facility]. Please contact [facility] at [phone] with questions or concerns regarding your invoice.   IF you received labwork today, you will receive an invoice from [facility]. Please contact [facility] at [phone] with questions or concerns regarding your invoice.   Our billing staff will not be able to assist you with questions regarding bills from these companies.  You will be contacted with the lab results as soon as they are available. The fastest way to get your results is to activate your My Chart account. Instructions are located on the last page of this paperwork. If you have not heard from [place] regarding the results in 2 weeks, please contact this office.

## 2018-03-17 NOTE — Progress Notes (Signed)
Wesley Mcknight  MRN: [id number] DOB: May 23, 1981  PCP: Patient, No Pcp Per  Subjective:  Pt is a 37 year old male who presents to clinic for follow-up from hospital encounter.  He was seen in the [facility] emergency department on 7/21 for chest pain, shortness of breath, burping/belching, cough.  Negative d-dimer, stable vital signs, EKG without acute abnormalities, negative troponin, negative chest x-ray.  Patient was given a GI cocktail with full relief of symptoms.  He was diagnosed with GERD, started on omeprazole. He is asymptomatic today. Denies medication side effects.   Patient feels like he has had GERD "since I was a kid".  Endorses symptoms of food coming up easily, chest pain, burning sensation.  Symptoms are worse when he laid down.  Endorses episodes of being woken up in the middle of the night - this would happen once a week or so. Has not had this problem since starting PPI.   He has cut out milk - this made it worse.  Cut back on greasy foods - this has helped. If he eats pizza he will throw up the grease only then go to sleep. "It can go up and down really easily".   In the past laying down made symptoms worse.   This is the first time he has taken PPI. Never taken otc meds.  Never been tested for H Pylori  Never smoker.  Drinks 1-2 beers/week.   Review of Systems  Respiratory: Positive for cough. Negative for shortness of breath and wheezing.   Cardiovascular: Positive for chest pain. Negative for palpitations and leg swelling.  Gastrointestinal: Positive for abdominal pain. Negative for constipation, diarrhea, nausea and vomiting.    There are no active problems to display for this patient.   Current Outpatient Medications on File Prior to Visit  Medication Sig Dispense Refill  . omeprazole (PRILOSEC) 20 MG capsule Take 1 capsule (20 mg total) by mouth daily. 30 capsule 1  . amoxicillin-clavulanate (AUGMENTIN) 875-125 MG tablet Take 1 tablet by mouth 2  (two) times daily. (Patient not taking: Reported on 03/17/2018) 20 tablet 0   No current facility-administered medications on file prior to visit.     Allergies  Allergen Reactions  . Hydrocodone Other (See Comments)    Panic attacks     Objective:  BP 128/72 (BP Location: Right Arm)   Pulse 76   Temp 98.6 F (37 C) (Oral)   Resp 16   Ht 5\' 9"  (1.753 m)   Wt 194 lb 9.6 oz (88.3 kg)   SpO2 95%   BMI 28.74 kg/m   Physical Exam  Constitutional: He is oriented to person, place, and time. He appears well-developed and well-nourished.  Cardiovascular: Normal rate and regular rhythm.  Pulmonary/Chest: No respiratory distress.  Abdominal: Soft. There is no tenderness.  Neurological: He is alert and oriented to person, place, and time.  Skin: Skin is warm and dry.  Psychiatric: He has a normal mood and affect. His behavior is normal. Judgment and thought content normal.  Vitals reviewed.   Assessment and Plan :  1. Gastroesophageal reflux disease, esophagitis presence not specified -Patient presents for follow-up after evaluation of chest pain at [facility].  He had a negative work-up and diagnosed with reflux.  He is feeling better after starting omeprazole.  Denies side effects.  Will not test for H. pylori today as he is taking a PPI currently.  Referral to GI for further evaluation and testing. - Ambulatory referral to Gastroenterology 2.  Encounter for examination following treatment at hospital   Mercer Pod, PA-C  [facility] 03/17/2018 4:43 PM  Please note: Portions of this report may have been transcribed using dragon voice recognition software. Every effort was made to ensure accuracy; however, inadvertent computerized transcription errors may be present.

## 2018-03-20 ENCOUNTER — Encounter: Payer: Self-pay | Admitting: Gastroenterology

## 2018-04-10 ENCOUNTER — Ambulatory Visit: Payer: BLUE CROSS/BLUE SHIELD | Admitting: Emergency Medicine

## 2018-04-13 ENCOUNTER — Other Ambulatory Visit: Payer: Self-pay

## 2018-04-13 ENCOUNTER — Encounter: Payer: Self-pay | Admitting: Physician Assistant

## 2018-04-13 ENCOUNTER — Ambulatory Visit: Payer: BLUE CROSS/BLUE SHIELD | Admitting: Physician Assistant

## 2018-04-13 VITALS — BP 120/70 | HR 64 | Temp 98.5°F | Resp 16 | Ht 69.0 in | Wt 196.4 lb

## 2018-04-13 DIAGNOSIS — J321 Chronic frontal sinusitis: Secondary | ICD-10-CM | POA: Diagnosis not present

## 2018-04-13 MED ORDER — AMOXICILLIN-POT CLAVULANATE 875-125 MG PO TABS: 1.0000 | ORAL_TABLET | Freq: Two times a day (BID) | ORAL | 0 refills | Status: DC

## 2018-04-13 NOTE — Progress Notes (Signed)
   Wesley Mcknight  MRN: [id number] DOB: Sep 15, 1980  PCP: Patient, No Pcp Per  Subjective:  Pt is a 37 year old male who presents to clinic for sinus pressure. Endorses green mucus and pain above his right eyebrow. Symptoms have been present x several months and have worsened over the past few weeks.   He was treated for sinusitis with augmentin 09/2017.   Review of Systems  Constitutional: Negative for chills, diaphoresis, fatigue and fever.  HENT: Positive for congestion and sinus pressure. Negative for postnasal drip, rhinorrhea, sinus pain, sneezing and sore throat.   Respiratory: Negative for cough, shortness of breath and wheezing.     There are no active problems to display for this patient.   Current Outpatient Medications on File Prior to Visit  Medication Sig Dispense Refill  . omeprazole (PRILOSEC) 20 MG capsule Take 1 capsule (20 mg total) by mouth daily. 30 capsule 1   No current facility-administered medications on file prior to visit.     Allergies  Allergen Reactions  . Hydrocodone Other (See Comments)    Panic attacks     Objective:  BP 120/70 (BP Location: Left Arm, Patient Position: Sitting, Cuff Size: Normal)   Pulse 64   Temp 98.5 F (36.9 C) (Oral)   Resp 16   Ht 5\' 9"  (1.753 m)   Wt 196 lb 6.4 oz (89.1 kg)   SpO2 96%   BMI 29.00 kg/m   Physical Exam  Constitutional: He is oriented to person, place, and time. No distress.  HENT:  Right Ear: Tympanic membrane normal.  Left Ear: Tympanic membrane normal.  Nose: Mucosal edema present. No rhinorrhea. Right sinus exhibits no maxillary sinus tenderness and no frontal sinus tenderness. Left sinus exhibits no maxillary sinus tenderness and no frontal sinus tenderness.  Mouth/Throat: Oropharynx is clear and moist and mucous membranes are normal.  Cardiovascular: Normal rate, regular rhythm and normal heart sounds.  Pulmonary/Chest: Effort normal and breath sounds normal. No respiratory distress. He  has no wheezes. He has no rales.  Neurological: He is alert and oriented to person, place, and time.  Skin: Skin is warm and dry.  Psychiatric: Judgment normal.  Vitals reviewed.   Assessment and Plan :  1. Chronic frontal sinusitis - amoxicillin-clavulanate (AUGMENTIN) 875-125 MG tablet; Take 1 tablet by mouth 2 (two) times daily.  Dispense: 20 tablet; Refill: 0   Mercer Pod, PA-C  [facility] 04/13/2018 9:14 AM  Please note: Portions of this report may have been transcribed using dragon voice recognition software. Every effort was made to ensure accuracy; however, inadvertent computerized transcription errors may be present.

## 2018-04-13 NOTE — Patient Instructions (Addendum)
Augmentin twice daily for 10 days. Consider using a Neti Pot (you can buy this at your pharmacy). If not, try the saline sinus rinse.  Drink plenty of water - Water will help thin out your mucus.  Flonase nasal spray - use this twice daily.   Come back and see me in 10-14 days if you are not improving.    You can also consider: Analgesics and antipyretics - OTC analgesics and antipyretics such as nonsteroidal anti-inflammatory drugs and acetaminophen can be used for pain and fever relief as needed. Saline irrigation - Mechanical irrigation with saline may reduce the need for pain medication and improve overall patient comfort, particularly in patients with frequent sinus infections. It is important that irrigants be prepared from sterile or bottled water. (See below for instructions)  Intranasal glucocorticoids -  Intranasal glucocorticoids are likely to be most beneficial for patients with underlying allergies. This allows improved sinus drainage. A higher dose of intranasal glucocorticoids had a stronger effect on symptom improvement. -Other- ?Oral decongestants - Oral decongestants may be useful when eustachian tube dysfunction is a factor for patients with AVRS. These patients may benefit from a short course (three to five days) of oral decongestants. Oral decongestants should be used with caution in patients with cardiovascular disease, hypertension, angle-closure glaucoma, or bladder neck obstruction. ?Intranasal decongestants - Intranasal decongestants are often used as symptomatic therapies by patients. These agents, such as oxymetazoline, may provide a subjective sense of improved nasal patency. There is also concern that intranasal decongestants themselves may provoke mucosal inflammation. If used, topical decongestants should be used sparingly for no more than three consecutive days to avoid rebound congestion, addiction, and mucosal damage associated with long-term use. ?Antihistamines -  Antihistamines are frequently used for symptom relief due to their drying effects; however, there are no studies investigating their efficacy for ARS. Over-drying of the mucosa may lead to further discomfort. Additionally, antihistamines are often associated with adverse effects.     SALINE NASAL IRRIGATION  The benefits  1. Saline (saltwater) washes the mucus and irritants from your nose.  2. The sinus passages are moisturized.  3. Studies have also shown that a nasal irrigation improves cell function (the cells that move the mucus work better).  The recipe  Use a one-quart glass jar that is thoroughly cleansed.  You may use a large medical syringe (30 cc), water pick with an irrigation tip (preferred method), squeeze bottle, or Neti pot. Do not use a baby bulb syringe. The syringe or pick should be sterilized frequently or replaced every two to three weeks to avoid contamination and infection.  Fill with water that has been distilled, previously boiled, or otherwise sterilized. Plain tap water is not recommended, because it is not necessarily sterile.  Add 1 to 1 heaping teaspoons of pickling/canning salt. Do not use table salt, because it contains a large number of additives.  Add 1 teaspoon of baking soda (pure bicarbonate).  Mix ingredients together, and store at room temperature. Discard after one week.  You may also make up a solution from premixed packets that are commercially prepared specifically for nasal irrigation.  The instructions  Irrigate your nose with saline one to two times per day.   If you have been told to use nasal medication, you should always use your saline solution first. The nasal medication is much more effective when sprayed onto clean nasal membranes, and the spray will reach deeper into the nose.   Pour the amount of fluid you  plan to use into a clean bowl. Do not put your used syringe back into the storage container, because it contaminates your solution.    You may warm the solution slightly in the microwave, but be sure that the solution is not hot.   Bend over the sink (some people do this in the shower), and squirt the solution into each side of your nose, aiming the stream toward the back of your head, not the top of your head. The solution should flow into one nostril and out of the other, but it will not harm you if you swallow a little.   Some people experience a little burning sensation the first few times that they use buffered saline solution, but this usually goes away after they adapt to it.      IF you received an x-ray today, you will receive an invoice from [facility]. Please contact [facility] at [phone] with questions or concerns regarding your invoice.   IF you received labwork today, you will receive an invoice from [facility]. Please contact [facility] at [phone] with questions or concerns regarding your invoice.   Our billing staff will not be able to assist you with questions regarding bills from these companies.  You will be contacted with the lab results as soon as they are available. The fastest way to get your results is to activate your My Chart account. Instructions are located on the last page of this paperwork. If you have not heard from [place] regarding the results in 2 weeks, please contact this office.

## 2018-05-16 ENCOUNTER — Encounter: Payer: Self-pay | Admitting: Gastroenterology

## 2018-05-16 ENCOUNTER — Ambulatory Visit: Payer: BLUE CROSS/BLUE SHIELD | Admitting: Gastroenterology

## 2018-05-16 VITALS — BP 118/74 | HR 87 | Ht 69.5 in | Wt 192.0 lb

## 2018-05-16 DIAGNOSIS — R110 Nausea: Secondary | ICD-10-CM | POA: Diagnosis not present

## 2018-05-16 DIAGNOSIS — R1110 Vomiting, unspecified: Secondary | ICD-10-CM

## 2018-05-16 NOTE — Patient Instructions (Signed)
If you are age 37 or older, your body mass index should be between 23-30. Your Body mass index is 27.95 kg/m. If this is out of the aforementioned range listed, please consider follow up with your Primary Care Provider.  If you are age 39 or younger, your body mass index should be between 19-25. Your Body mass index is 27.95 kg/m. If this is out of the aformentioned range listed, please consider follow up with your Primary Care Provider.   It has been recommended to you by your physician that you have a(n) EGD completed. Per your request, we did not schedule the procedure(s) today. Please contact our office at [phone] should you decide to have the procedure completed.  CPT code for EGD 43235  It was a pleasure to see you today!  Dr. Loletha Carrow

## 2018-05-16 NOTE — Progress Notes (Signed)
[facility] Consult Note:  History: Wesley Mcknight 05/16/2018  Referring physician: Juanda Crumble, PA  Reason for consult/chief complaint: Gastroesophageal Reflux (has had reguritation "all his life"; food just keeps coming up and he rechews it; started PPI 1 month - doesn't see a difference) and Heartburn (made worse by dairy, can he be tested for lactose intolerance)   Subjective  HPI:  This is a very pleasant 37 year old man referred by primary care for suspected GERD. He describes a curious story going back his entire life, as far as he can remember.  Within 10 minutes after eating, he will have regurgitation of food which he frequently chews again and swallows.  This will happen throughout the day.  It seems to occur with any portion larger than a bagel.  There is occasional nausea with greasy foods, and he rarely has heartburn.  He just felt this was normal, and then saw primary care for for the first time in early August.  That occurred just after an ED visit on July 21 for an episode of chest pain that occurred upon awakening.  He had been waking up in the middle the night for about a week for unclear reasons with chest heaviness and felt his heart was racing.  He was given a diagnosis of probable GERD and put on omeprazole 20 mg daily.  Those episodes resolved, and he has gone times for 5 days without the medicine.  However, omeprazole has not caused any change in the previous symptoms. He noticed sometimes these things might be worse with dairy, and wondered if he could be tested for lactose intolerance. 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 He denies abdominal pain, bloating, obstipation or diarrhea or rectal bleeding.  He typically has a bowel movement once daily, appetite is good and weight stable.    ROS:  Review of Systems    Constitutional: Negative for appetite change and unexpected weight change.  HENT: Negative for mouth sores and voice change.   Eyes: Negative for pain and redness.  Respiratory: Negative for cough and shortness of breath.        Occasional dry cough  Cardiovascular: Negative for chest pain and palpitations.  Genitourinary: Negative for dysuria and hematuria.  Musculoskeletal: Negative for arthralgias and myalgias.  Skin: Negative for pallor and rash.  Neurological: Negative for weakness and headaches.  Hematological: Negative for adenopathy.     Past Medical History: History reviewed. No pertinent past medical history. No chronic medical problems He had a prolonged episode of sinusitis after a dental infection in 2018.  Past Surgical History: Past Surgical History:  Procedure Laterality Date  . PCL replacement  2000     Family History: Family History  Problem Relation Age of Onset  . Diabetes Mother   . Colon cancer Neg Hx   . Esophageal cancer Neg Hx   . Stomach cancer Neg Hx     Social History: Social History   Socioeconomic History  . Marital status: Single    Spouse name: Not on file  . Number of children: Not on file  . Years of education: Not on file  . Highest education level: Not on file  Occupational History  . Not on file  Social Needs  . Financial resource strain: Not on file  . Food insecurity:    Worry: Not on file    Inability:  Not on file  . Transportation needs:    Medical: Not on file    Non-medical: Not on file  Tobacco Use  . Smoking status: Never Smoker  . Smokeless tobacco: Never Used  Substance and Sexual Activity  . Alcohol use: Yes    Alcohol/week: 3.0 standard drinks    Types: 3 Standard drinks or equivalent per week  . Drug use: No  . Sexual activity: Not on file  Lifestyle  . Physical activity:    Days per week: Not on file    Minutes per session: Not on file  . Stress: Not on file  Relationships  . Social connections:     Talks on phone: Not on file    Gets together: Not on file    Attends religious service: Not on file    Active member of club or organization: Not on file    Attends meetings of clubs or organizations: Not on file    Relationship status: Not on file  Other Topics Concern  . Not on file  Social History Narrative  . 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 Hydrocodone Other (See Comments)    Panic attacks    Outpatient Meds: Current Outpatient Medications  Medication Sig Dispense Refill  . Multiple Vitamins-Minerals (CENTRUM ADULTS) TABS Take by mouth daily.    Marland Kitchen omeprazole (PRILOSEC) 20 MG capsule Take 1 capsule (20 mg total) by mouth daily. 30 capsule 1   No current facility-administered medications for this visit.       ___________________________________________________________________ Objective   Exam:  BP 118/74   Pulse 87   Ht 5' 9.5" (1.765 m)   Wt 192 lb (87.1 kg)   BMI 27.95 kg/m    General: this is a(n) well-appearing man, normal vocal quality and muscle mass  Eyes: sclera anicteric, no redness  ENT: oral mucosa moist without lesions, no cervical or supraclavicular lymphadenopathy, good dentition  CV: RRR without murmur, S1/S2, no JVD, no peripheral edema  Resp: clear to auscultation bilaterally, normal RR and effort noted  GI: soft, no tenderness, with active bowel sounds. No guarding or palpable organomegaly noted.  Skin; warm and dry, no rash or jaundice noted  Neuro: awake, alert and oriented x 3. Normal gross motor function and fluent speech  Labs:  CBC Latest Ref Rng & Units 03/05/2018  WBC 4.0 - 10.5 K/uL 6.7  Hemoglobin 13.0 - 17.0 g/dL 14.3  Hematocrit 39.0 - 52.0 % 40.3  Platelets 150 - 400 K/uL 241   CMP Latest Ref Rng & Units 03/05/2018  Glucose 70 - 99 mg/dL 94  BUN 6 - 20 mg/dL 15  Creatinine 0.61 - 1.24 mg/dL 0.85  Sodium 135 - 145 mmol/L 142  Potassium 3.5 - 5.1 mmol/L 3.5  Chloride  98 - 111 mmol/L 109  CO2 22 - 32 mmol/L 24  Calcium 8.9 - 10.3 mg/dL 9.4   Normal EKG, negative troponin July 2019  Assessment: Encounter Diagnoses  Name Primary?  . Regurgitation of food Yes  . Nausea without vomiting     While there are features of reflux, I suspect this may be rumination.  Delayed gastric emptying, gastric outlet obstruction or partial duodenal obstruction also considerations.  Achalasia seems less likely since he does not describe chest fullness or heaviness/dysphagia.  Dairy might be a trigger for his symptoms by his report, but he does not have typical symptoms of lactose intolerance.  I encouraged him to continue avoiding  or minimizing intake of foods that seem to exacerbate the symptoms.  Plan:  I recommended upper endoscopy for structural evaluation of the upper GI tract.  If normal, I would then proceed with esophageal manometry and impedance testing. He is agreeable to upper endoscopy, but wanted to check with insurance regarding the cost first.  Information was given, he has contact info for our office, and we will wait to hear from them. The meantime, I think he can stop the omeprazole as it is unclear that is doing much for him at this point.  Thank you for the courtesy of this consult.  Please call me with any questions or concerns.  Nelida Meuse III  CC: Juanda Crumble, [place]

## 2018-07-20 ENCOUNTER — Encounter: Payer: Self-pay | Admitting: Gastroenterology

## 2018-09-06 ENCOUNTER — Encounter: Payer: PRIVATE HEALTH INSURANCE | Admitting: Gastroenterology

## 2019-04-04 ENCOUNTER — Encounter: Payer: Self-pay | Admitting: Family Medicine

## 2019-04-04 ENCOUNTER — Ambulatory Visit (INDEPENDENT_AMBULATORY_CARE_PROVIDER_SITE_OTHER): Payer: BC Managed Care – PPO | Admitting: Family Medicine

## 2019-04-04 ENCOUNTER — Other Ambulatory Visit: Payer: Self-pay

## 2019-04-04 ENCOUNTER — Ambulatory Visit (INDEPENDENT_AMBULATORY_CARE_PROVIDER_SITE_OTHER): Payer: BC Managed Care – PPO

## 2019-04-04 VITALS — BP 140/88 | HR 79 | Temp 98.2°F | Resp 16 | Ht 69.0 in | Wt 192.0 lb

## 2019-04-04 DIAGNOSIS — R0789 Other chest pain: Secondary | ICD-10-CM

## 2019-04-04 DIAGNOSIS — R079 Chest pain, unspecified: Secondary | ICD-10-CM

## 2019-04-04 DIAGNOSIS — R042 Hemoptysis: Secondary | ICD-10-CM | POA: Diagnosis not present

## 2019-04-04 NOTE — Patient Instructions (Addendum)
   Avoid strenuous exercise over the next 5 to 7 days.  If you have any further episodes of coughing up blood like this we need to refer you to a pulmonologist (lung specialist) and consider getting a CT scan of the chest.  Avoid aspirin  Your chest x-ray shows a tiny bit of scoliosis (curvature) in the thoracic spine.  This is of no concern, I am just letting you know for information since it was noted.  In the event of acutely coughing up more blood, shortness of breath, or other symptoms, going to the emergency room.    If you have lab work done today you will be contacted with your lab results within the next 2 weeks.  If you have not heard from [place] then please contact us. The fastest way to get your results is to register for My Chart.   IF you received an x-ray today, you will receive an invoice from [facility]. Please contact [facility] at [phone] with questions or concerns regarding your invoice.   IF you received labwork today, you will receive an invoice from [facility]. Please contact [facility] at [phone] with questions or concerns regarding your invoice.   Our billing staff will not be able to assist you with questions regarding bills from these companies.  You will be contacted with the lab results as soon as they are available. The fastest way to get your results is to activate your My Chart account. Instructions are located on the last page of this paperwork. If you have not heard from [place] regarding the results in 2 weeks, please contact this office.

## 2019-04-04 NOTE — Progress Notes (Addendum)
Patient ID: Wesley Mcknight, male    DOB: [birth year]  Age: 38 y.o. MRN: [id number]  Chief Complaint  Patient presents with  . Chest Pain    pt states he was riding his bike this morning and started to feel a pain in the right side of his chest, sharp pain when breathing. Patient states he also spit up blood.    Subjective:   38 year old male who was out on his bicycle this morning, having not written for a week or 2.  After a few minutes of riding he coughed and started coughing up blood.  He continued on his ride.  Felt a mouthful of blood which he showed me the photo of.  He has a little discomfort in his anterior chest.  No pain on deep breathing.  No pain in his calves.  No shortness of breath.  At times he has seen a little bit of blood streaking in his mucus other times.  He does not smoke.  No history of blood clots.  Occasionally takes a baby aspirin, none for several months. .  Current allergies, medications, problem list, past/family and social histories reviewed.  Objective:  BP 140/88   Pulse 79   Temp 98.2 F (36.8 C) (Oral)   Resp 16   Ht 5\' 9"  (1.753 m)   Wt 192 lb (87.1 kg)   SpO2 98%   BMI 28.35 kg/m   Alert and healthy-appearing.  Throat clear.  Neck supple without nodes.  Chest is clear to auscultation.  Heart regular without murmurs.  No calf tenderness and negative Homans sign.  Vitals are stable as noted above.  EKG is normal.  Chest x-ray does show minimal scoliosis, which he has had in the past though he had never been told that.  Assessment & Plan:   Assessment: 1. Chest pain, unspecified type   2. Hemoptysis   3. Anterior chest wall pain       Plan: See instructions.  I decided to do a d-dimer just to make sure there is no concern of clots though clinically I think that this is probably just from a little vessel somewhere opening up and causing the bleeding.  If he keeps having problems he will need to see a pulmonologist and get bronchoscopy  and probably a CT scan of the chest.  Orders Placed This Encounter  Procedures  . DG Chest 2 View    Standing Status:   Future    Number of Occurrences:   1    Standing Expiration Date:   06/03/2020    Order Specific Question:   Reason for Exam (SYMPTOM  OR DIAGNOSIS REQUIRED)    Answer:   hemoptysis, right chest pain centrally    Order Specific Question:   Preferred imaging location?    Answer:   External    Order Specific Question:   Radiology Contrast Protocol - do NOT remove file path    Answer:   \\[email or web address]  . CBC  . D-dimer, quantitative (not at [facility])    Call report to Dr. Linna Darner [phone]  . EKG 12-Lead    No orders of the defined types were placed in this encounter.        Patient Instructions     Avoid strenuous exercise over the next 5 to 7 days.  If you have any further episodes of coughing up blood like this we need to refer you to a pulmonologist (lung specialist) and consider getting a CT scan of the chest.  Avoid aspirin  Your chest x-ray shows a tiny bit of scoliosis (curvature) in the thoracic spine.  This is of no concern, I am just letting you know for information since it was noted.  In the event of acutely coughing up more blood, shortness of breath, or other symptoms, going to the emergency room.    If you have lab work done today you will be contacted with your lab results within the next 2 weeks.  If you have not heard from [place] then please contact us. The fastest way to get your results is to register for My Chart.   IF you received an x-ray today, you will receive an invoice from [facility]. Please contact [facility] at [phone] with questions or concerns regarding your invoice.   IF you received labwork today, you will receive an invoice from [facility]. Please contact [facility] at [phone] with questions or concerns regarding your invoice.   Our billing staff will  not be able to assist you with questions regarding bills from these companies.  You will be contacted with the lab results as soon as they are available. The fastest way to get your results is to activate your My Chart account. Instructions are located on the last page of this paperwork. If you have not heard from [place] regarding the results in 2 weeks, please contact this office.      Addendum: Thursday, 04/05/2019 I received a call from the lab late last night regarding the stat lab.  Apparently there was an error in collection, and it was picked up by the regular courier.  When the stat courier got there the specimen was not in the box.  The routine specimen did not get there until sometime later in the night at about 130 or so I was called they were unable to run the specimen.  If I will recall correctly it is been out too long.  I spoke the case this morning.  I have had the lab called only her and told him to come back by for a redraw.  He is doing fine.  Had a tiny bit of bloody mucus last night and then none today.  Does not have any chest pain when he breathes in now.  Feels okay, but understands the need to come by later today for a redraw.   No follow-ups on file.   Ruben Reason, MD 04/04/2019

## 2019-04-05 ENCOUNTER — Ambulatory Visit (INDEPENDENT_AMBULATORY_CARE_PROVIDER_SITE_OTHER): Payer: BC Managed Care – PPO | Admitting: Family Medicine

## 2019-04-05 ENCOUNTER — Other Ambulatory Visit: Payer: Self-pay

## 2019-04-05 ENCOUNTER — Telehealth: Payer: Self-pay

## 2019-04-05 DIAGNOSIS — R042 Hemoptysis: Secondary | ICD-10-CM

## 2019-04-05 DIAGNOSIS — R079 Chest pain, unspecified: Secondary | ICD-10-CM

## 2019-04-05 LAB — CBC
Hematocrit: 44.2 % (ref 37.5–51.0)
Hemoglobin: 15.7 g/dL (ref 13.0–17.7)
MCH: 31.7 pg (ref 26.6–33.0)
MCHC: 35.5 g/dL (ref 31.5–35.7)
MCV: 89 fL (ref 79–97)
Platelets: 207 10*3/uL (ref 150–450)
RBC: 4.95 x10E6/uL (ref 4.14–5.80)
RDW: 11.8 % (ref 11.6–15.4)
WBC: 5.1 10*3/uL (ref 3.4–10.8)

## 2019-04-05 LAB — D-DIMER, QUANTITATIVE (NOT AT ARMC)

## 2019-04-05 LAB — D-DIMER, QUANTITATIVE: D-DIMER: 0.22 mg/L FEU (ref 0.00–0.49)

## 2019-04-05 NOTE — Addendum Note (Signed)
Addended by: Burnis Kingfisher on: 04/05/2019 08:48 AM   Modules accepted: Orders

## 2019-04-05 NOTE — Progress Notes (Signed)
Nurse visit for labs only

## 2019-04-05 NOTE — Telephone Encounter (Signed)
Call to pt to advise that D Dimer was normal per Dr. Linna Darner.  MD would like pt to notify us if coughing up blood continues.  Pt may need referral to pulmonary or chest CT.  Pt verbalizes understanding.  States it has only been light streaks of blood today and is not as bad as yesterday.  States he will keep track and reach out if needed.

## 2019-04-05 NOTE — Addendum Note (Signed)
Addended by: Burnis Kingfisher on: 04/05/2019 03:07 PM   Modules accepted: Orders

## 2019-09-03 ENCOUNTER — Other Ambulatory Visit: Payer: Self-pay

## 2019-09-03 ENCOUNTER — Other Ambulatory Visit (HOSPITAL_COMMUNITY): Payer: Self-pay | Admitting: Orthopedic Surgery

## 2019-09-03 ENCOUNTER — Ambulatory Visit (HOSPITAL_COMMUNITY)
Admission: RE | Admit: 2019-09-03 | Discharge: 2019-09-03 | Disposition: A | Discharge status: Home / Self Care | Payer: BLUE CROSS/BLUE SHIELD | Source: Ambulatory Visit | Attending: Orthopedic Surgery | Admitting: Orthopedic Surgery

## 2019-09-03 DIAGNOSIS — M7989 Other specified soft tissue disorders: Secondary | ICD-10-CM

## 2019-09-03 DIAGNOSIS — M79604 Pain in right leg: Secondary | ICD-10-CM

## 2019-09-03 NOTE — Progress Notes (Signed)
Right lower extremity venous duplex has been completed. Preliminary results can be found in CV Proc through chart review.  Results were faxed to Dr. French Ana.  09/03/19 12:32 PM Wesley Mcknight RVT

## 2020-02-25 IMAGING — DX CHEST - 2 VIEW
2 series · 2 of 2 positions shown · non-contrast
Comparison: 03/05/2018

CLINICAL DATA: Hemoptysis, chest pain

EXAM:
CHEST - 2 VIEW

[chest pa]
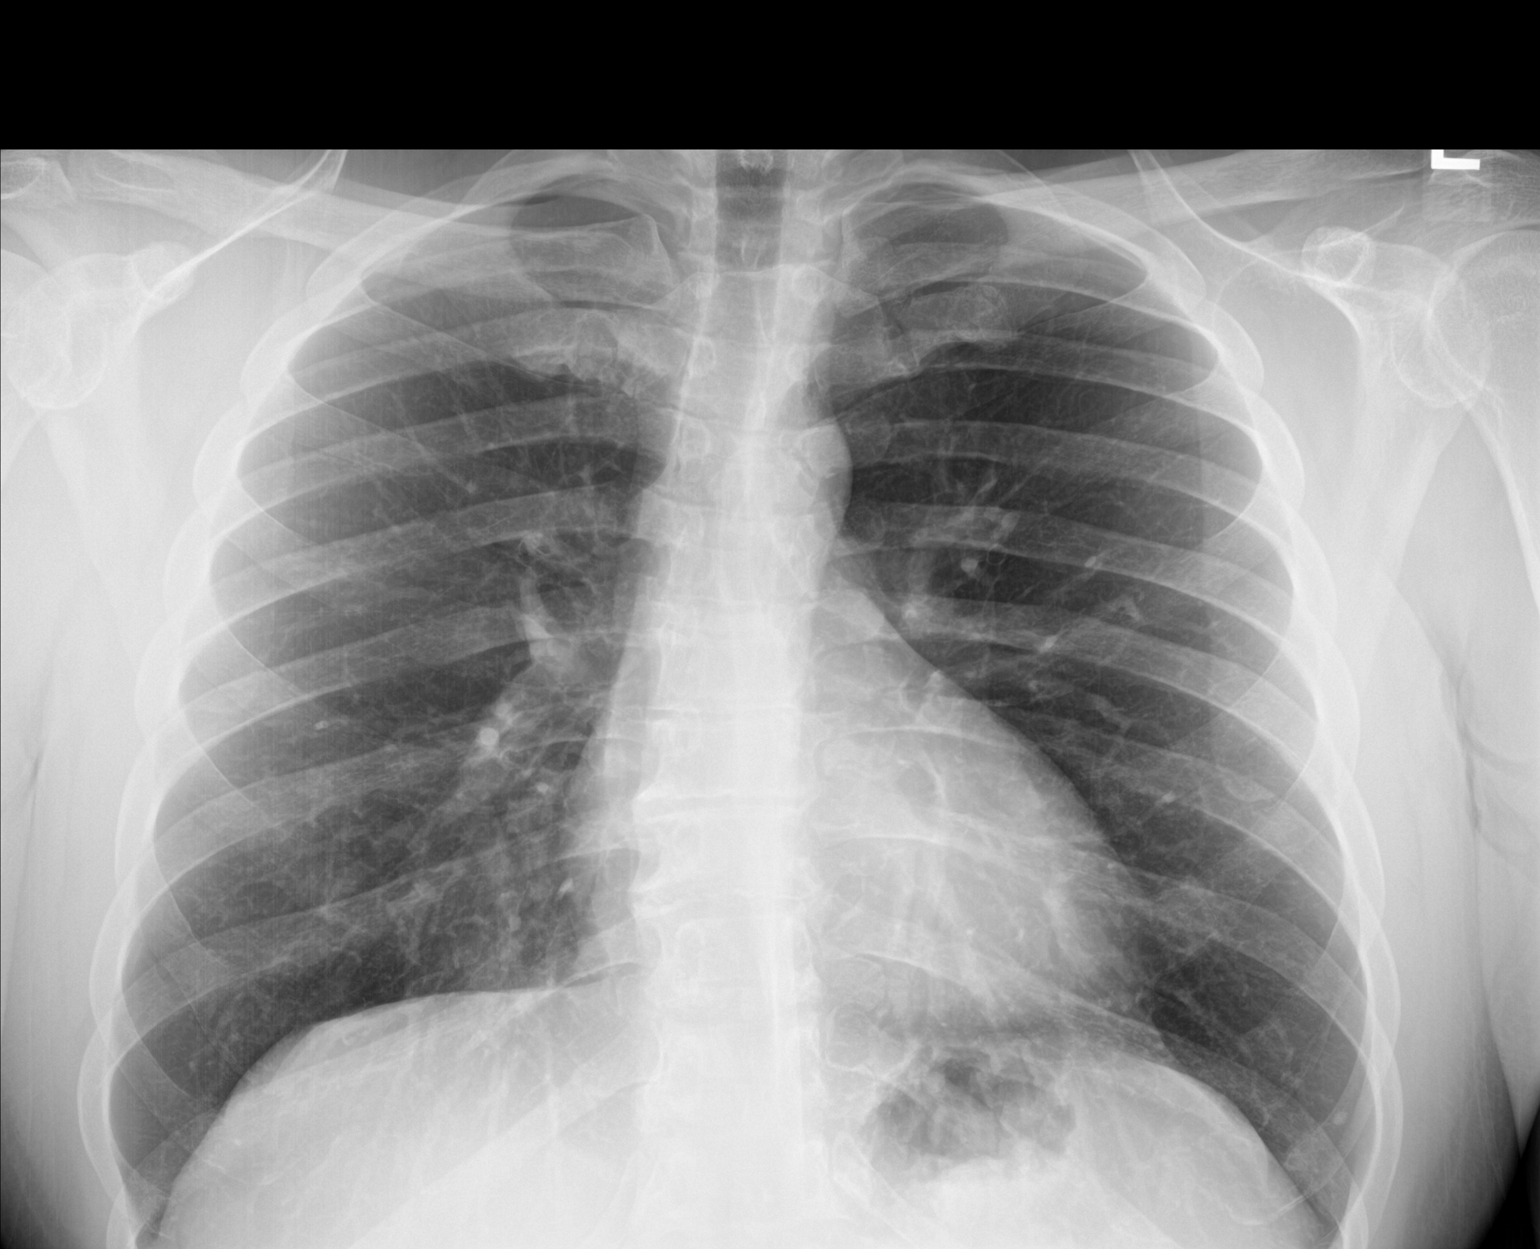

[chest lat]
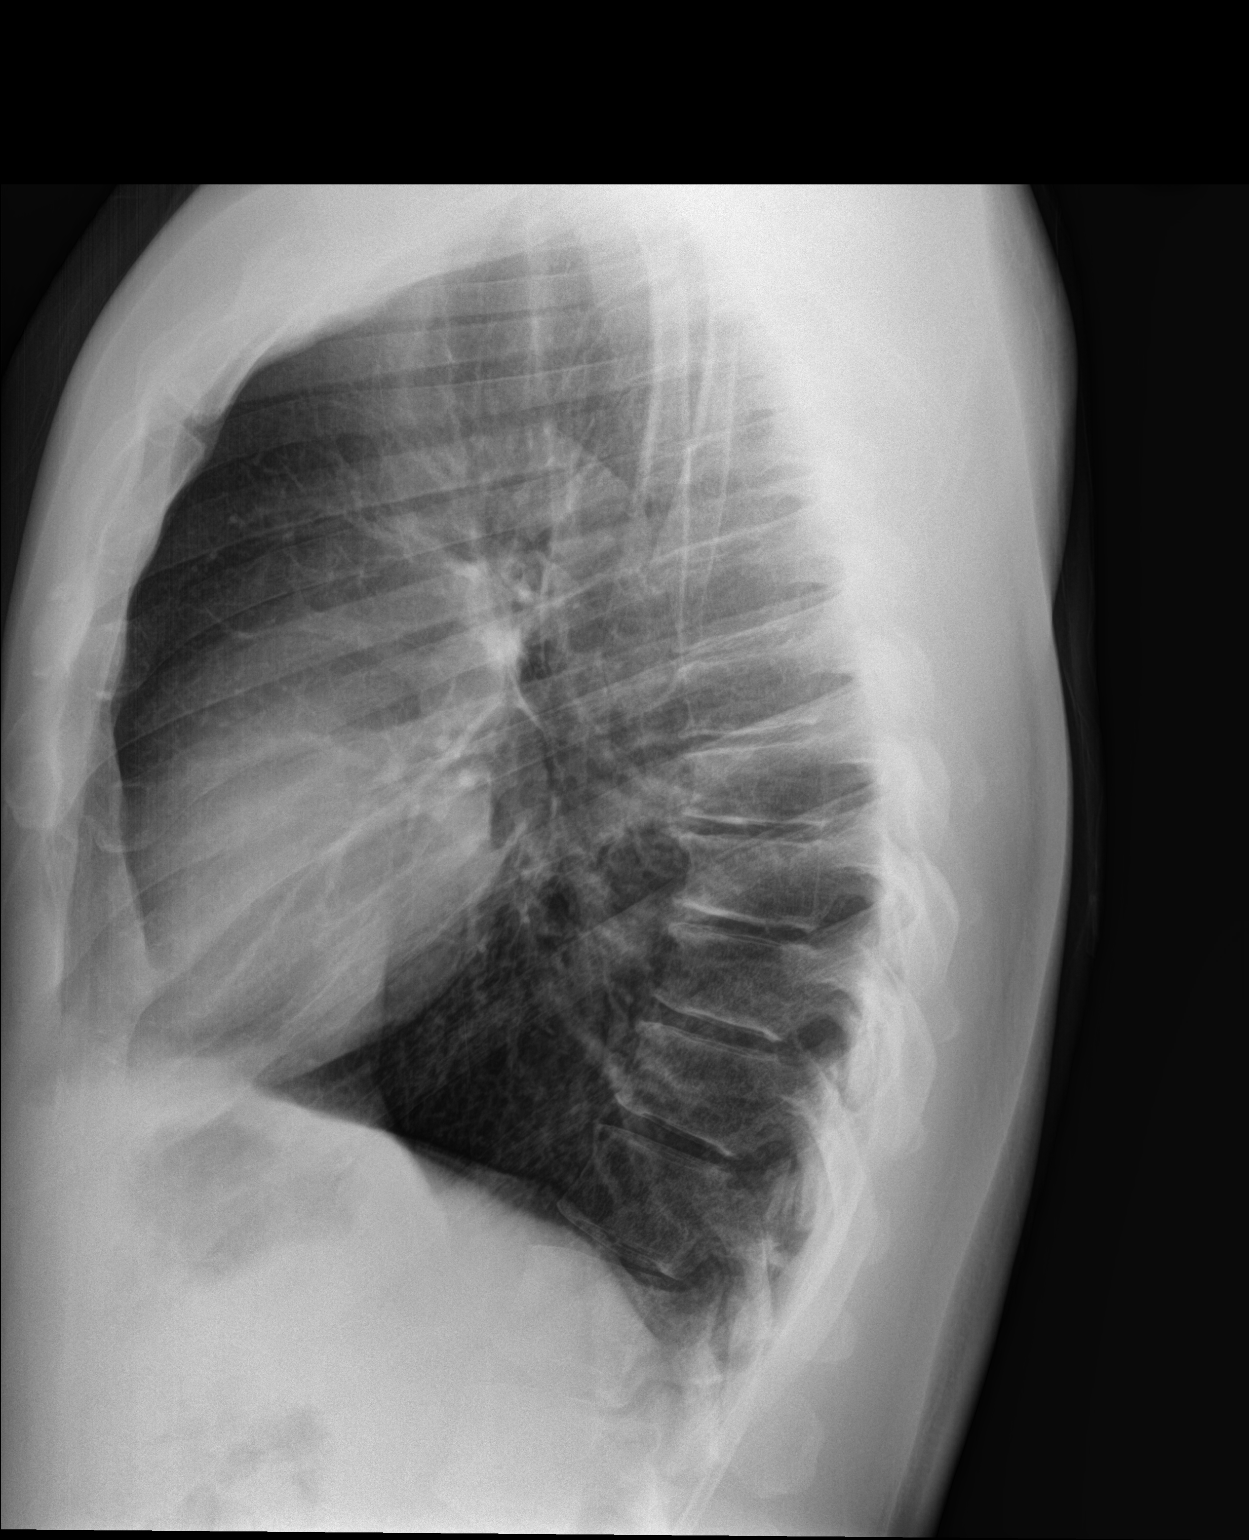

[2 of 2 positions shown; findings below may reference images not displayed]

FINDINGS: The heart size and mediastinal contours are within normal limits.
Both lungs are clear. The visualized skeletal structures are
unremarkable except for minor thoracic degenerative changes and
scoliosis as before.
IMPRESSION: No active cardiopulmonary disease.

## 2020-03-27 ENCOUNTER — Institutional Professional Consult (permissible substitution): Payer: BLUE CROSS/BLUE SHIELD | Admitting: Pulmonary Disease

## 2020-05-06 ENCOUNTER — Other Ambulatory Visit: Payer: Self-pay

## 2020-05-06 ENCOUNTER — Ambulatory Visit (INDEPENDENT_AMBULATORY_CARE_PROVIDER_SITE_OTHER): Payer: BC Managed Care – PPO | Admitting: Pulmonary Disease

## 2020-05-06 ENCOUNTER — Encounter: Payer: Self-pay | Admitting: Pulmonary Disease

## 2020-05-06 VITALS — BP 122/76 | HR 77 | Temp 98.3°F | Ht 70.0 in | Wt 215.6 lb

## 2020-05-06 DIAGNOSIS — R042 Hemoptysis: Secondary | ICD-10-CM | POA: Diagnosis not present

## 2020-05-06 NOTE — Progress Notes (Signed)
Synopsis: Referred by De Nurse, MD for hemoptysis  Subjective:   PATIENT ID: Wesley Mcknight GENDER: male DOB: 1980-12-15, MRN: [id number]   HPI  Chief Complaint  Patient presents with   Consult    blood accumaltes in chest and throat as he coughs   Wesley Mcknight is a 39 year old male, non-smoker with no significant medical history who is referred to pulmonary clinic for hemoptysis.  Patient reports having his first episode of hemoptysis over 1 year ago. He coughs up bright red blood after physical exertion from working out. The episodes occur sparingly which is why he did not present earlier for evaluation. Most recently he had an episode last month where he filled half of a coffee mug with bright red blood and some clots along with mucous. He denies sinus congestion, drainage or nose bleeds. He does have severe reflux disease which he manages with diet and raising the head of his bed. He was previously on PPI therapy.   He denies any cough, wheezing or shortness of breath. He denies any chest discomfort. He denies any easy bleeding or bruising. He denies fevers, chills, sweats or weight loss.   History reviewed. No pertinent past medical history.   Family History  Problem Relation Age of Onset   Diabetes Mother    Colon cancer Neg Hx    Esophageal cancer Neg Hx    Stomach cancer Neg Hx      Social History   Socioeconomic History   Marital status: Single    Spouse name: Not on file   Number of children: Not on file   Years of education: Not on file   Highest education level: Not on file  Occupational History   Not on file  Tobacco Use   Smoking status: Never Smoker   Smokeless tobacco: Never Used  Vaping Use   Vaping Use: Never used  Substance and Sexual Activity   Alcohol use: Yes    Alcohol/week: 3.0 standard drinks    Types: 3 Standard drinks or equivalent per week   Drug use: No   Sexual activity: Not on file  Other Topics  Concern   Not on file  Social History Narrative   Not on file   Social Determinants of Health   Financial Resource Strain:    Difficulty of Paying Living Expenses: Not on file  Food Insecurity:    Worried About Wesley Mcknight in the Last Year: Not on file   Ran Out of Food in the Last Year: Not on file  Transportation Needs:    Lack of Transportation (Medical): Not on file   Lack of Transportation (Non-Medical): Not on file  Physical Activity:    Days of Exercise per Week: Not on file   Minutes of Exercise per Session: Not on file  Stress:    Feeling of Stress : Not on file  Social Connections:    Frequency of Communication with Friends and Family: Not on file   Frequency of Social Gatherings with Friends and Family: Not on file   Attends Religious Services: Not on file   Active Member of Clubs or Organizations: Not on file   Attends Archivist Meetings: Not on file   Marital Status: Not on file  Intimate Partner Violence:    Fear of Current or Ex-Partner: Not on file   Emotionally Abused: Not on file   Physically Abused: Not on file   Sexually Abused: Not on file  Allergies  Allergen Reactions   Hydrocodone Other (See Comments)    Panic attacks     Outpatient Medications Prior to Visit  Medication Sig Dispense Refill   Multiple Vitamins-Minerals (CENTRUM ADULTS) TABS Take by mouth daily.     omeprazole (PRILOSEC) 20 MG capsule Take 1 capsule (20 mg total) by mouth daily. (Patient not taking: Reported on 05/06/2020) 30 capsule 1   No facility-administered medications prior to visit.    Review of Systems  Constitutional: Negative for chills, diaphoresis, fever, malaise/fatigue and weight loss.  HENT: Negative for congestion, nosebleeds, sinus pain and sore throat.   Eyes: Negative for blurred vision and pain.  Respiratory: Positive for hemoptysis. Negative for cough, sputum production, shortness of breath and wheezing.     Cardiovascular: Negative for chest pain, palpitations, orthopnea, claudication, leg swelling and PND.  Gastrointestinal: Positive for heartburn. Negative for abdominal pain, blood in stool, constipation, diarrhea, melena, nausea and vomiting.  Genitourinary: Negative for dysuria and hematuria.  Musculoskeletal: Negative for joint pain and myalgias.  Skin: Negative for itching and rash.  Neurological: Negative for dizziness, loss of consciousness, weakness and headaches.  Endo/Heme/Allergies: Does not bruise/bleed easily.  Psychiatric/Behavioral: Negative.     Objective:  Physical Exam Constitutional:      General: He is not in acute distress.    Appearance: Normal appearance. He is normal weight. He is not ill-appearing.  HENT:     Head: Normocephalic and atraumatic.     Nose: Nose normal.     Mouth/Throat:     Mouth: Mucous membranes are moist.     Pharynx: Oropharynx is clear.  Eyes:     General: No scleral icterus.    Extraocular Movements: Extraocular movements intact.     Conjunctiva/sclera: Conjunctivae normal.     Pupils: Pupils are equal, round, and reactive to light.  Cardiovascular:     Rate and Rhythm: Normal rate and regular rhythm.     Pulses: Normal pulses.     Heart sounds: Normal heart sounds. No murmur heard.   Pulmonary:     Effort: Pulmonary effort is normal.     Breath sounds: Normal breath sounds. No wheezing, rhonchi or rales.  Abdominal:     General: Abdomen is flat. Bowel sounds are normal. There is no distension.     Palpations: Abdomen is soft.     Tenderness: There is no abdominal tenderness.  Musculoskeletal:     Right lower leg: No edema.     Left lower leg: No edema.  Skin:    General: Skin is warm and dry.  Neurological:     General: No focal deficit present.     Mental Status: He is alert and oriented to person, place, and time. Mental status is at baseline.  Psychiatric:        Mood and Affect: Mood normal.        Behavior: Behavior  normal.        Thought Content: Thought content normal.        Judgment: Judgment normal.     Vitals:   05/06/20 0915  BP: 122/76  Pulse: 77  Temp: 98.3 F (36.8 C)  TempSrc: Oral  SpO2: 97%  Weight: 215 lb 9.6 oz (97.8 kg)  Height: 5\' 10"  (1.778 m)    CBC    Component Value Date/Time   WBC 5.1 04/04/2019 1729   WBC 6.7 03/05/2018 0703   RBC 4.95 04/04/2019 1729   RBC 4.61 03/05/2018 0703   HGB 15.7 04/04/2019  1729   HCT 44.2 04/04/2019 1729   PLT 207 04/04/2019 1729   MCV 89 04/04/2019 1729   MCH 31.7 04/04/2019 1729   MCH 31.0 03/05/2018 0703   MCHC 35.5 04/04/2019 1729   MCHC 35.5 03/05/2018 0703   RDW 11.8 04/04/2019 1729   Chest imaging: CXR 03/17/20 reviewed; no opacities, nodules or masses noted. No pleural effusions.  PFT: None on file  Labs: CBC 03/17/20: WBC 6.9, HGB 15.1, HCT 43, Plt 229  Assessment & Plan:   Hemoptysis - Plan: CT Chest W Contrast  Discussion: Murl Golladay is a 39 year old male, non-smoker with hemoptysis. The etiology of his hemoptysis is unknown at this time. Chest radiograph does not indicate any nodule, mass or opacities. He does not appear to have an infectious component at this time. There is concern for an aberrant vessel that may become stressed during periods of strenuous cardiopulmonary exercise. We will further evaluate his hemoptysis with a CT chest scan with contrast. We discussed the possibility of pursuing a bronchoscopy after the CT scan which he understood.   Freda Jackson, MD [facility] Office: [phone]   Current Outpatient Medications:    Multiple Vitamins-Minerals (CENTRUM ADULTS) TABS, Take by mouth daily., Disp: , Rfl:    omeprazole (PRILOSEC) 20 MG capsule, Take 1 capsule (20 mg total) by mouth daily. (Patient not taking: Reported on 05/06/2020), Disp: 30 capsule, Rfl: 1

## 2020-05-06 NOTE — Patient Instructions (Signed)
-   We will schedule a CT chest scan with contrast and call you with the results to determine the next steps

## 2020-05-06 NOTE — H&P (View-Only) (Signed)
Synopsis: Referred by De Nurse, MD for hemoptysis  Subjective:   PATIENT ID: Wesley Mcknight GENDER: male DOB: 09/30/1980, MRN: [id number]   HPI  Chief Complaint  Patient presents with  . Consult    blood accumaltes in chest and throat as he coughs   Wesley Mcknight is a 39 year old male, non-smoker with no significant medical history who is referred to pulmonary clinic for hemoptysis.  Patient reports having his first episode of hemoptysis over 1 year ago. He coughs up bright red blood after physical exertion from working out. The episodes occur sparingly which is why he did not present earlier for evaluation. Most recently he had an episode last month where he filled half of a coffee mug with bright red blood and some clots along with mucous. He denies sinus congestion, drainage or nose bleeds. He does have severe reflux disease which he manages with diet and raising the head of his bed. He was previously on PPI therapy.   He denies any cough, wheezing or shortness of breath. He denies any chest discomfort. He denies any easy bleeding or bruising. He denies fevers, chills, sweats or weight loss.   History reviewed. No pertinent past medical history.   Family History  Problem Relation Age of Onset  . Diabetes Mother   . Colon cancer Neg Hx   . Esophageal cancer Neg Hx   . Stomach cancer Neg Hx      Social History   Socioeconomic History  . Marital status: Single    Spouse name: Not on file  . Number of children: Not on file  . Years of education: Not on file  . Highest education level: Not on file  Occupational History  . Not on file  Tobacco Use  . Smoking status: Never Smoker  . Smokeless tobacco: Never Used  Vaping Use  . Vaping Use: Never used  Substance and Sexual Activity  . Alcohol use: Yes    Alcohol/week: 3.0 standard drinks    Types: 3 Standard drinks or equivalent per week  . Drug use: No  . Sexual activity: Not on file  Other Topics  Concern  . Not on file  Social History Narrative  . Not on file   Social Determinants of Health   Financial Resource Strain:   . Difficulty of Paying Living Expenses: Not on file  Food Insecurity:   . Worried About Charity fundraiser in the Last Year: Not on file  . Ran Out of Food in the Last Year: Not on file  Transportation Needs:   . Lack of Transportation (Medical): Not on file  . Lack of Transportation (Non-Medical): Not on file  Physical Activity:   . Days of Exercise per Week: Not on file  . Minutes of Exercise per Session: Not on file  Stress:   . Feeling of Stress : Not on file  Social Connections:   . Frequency of Communication with Friends and Family: Not on file  . Frequency of Social Gatherings with Friends and Family: Not on file  . Attends Religious Services: Not on file  . Active Member of Clubs or Organizations: Not on file  . Attends Archivist Meetings: Not on file  . Marital Status: Not on file  Intimate Partner Violence:   . Fear of Current or Ex-Partner: Not on file  . Emotionally Abused: Not on file  . Physically Abused: Not on file  . Sexually Abused: Not on file  Allergies  Allergen Reactions  . Hydrocodone Other (See Comments)    Panic attacks     Outpatient Medications Prior to Visit  Medication Sig Dispense Refill  . Multiple Vitamins-Minerals (CENTRUM ADULTS) TABS Take by mouth daily.    Marland Kitchen omeprazole (PRILOSEC) 20 MG capsule Take 1 capsule (20 mg total) by mouth daily. (Patient not taking: Reported on 05/06/2020) 30 capsule 1   No facility-administered medications prior to visit.    Review of Systems  Constitutional: Negative for chills, diaphoresis, fever, malaise/fatigue and weight loss.  HENT: Negative for congestion, nosebleeds, sinus pain and sore throat.   Eyes: Negative for blurred vision and pain.  Respiratory: Positive for hemoptysis. Negative for cough, sputum production, shortness of breath and wheezing.     Cardiovascular: Negative for chest pain, palpitations, orthopnea, claudication, leg swelling and PND.  Gastrointestinal: Positive for heartburn. Negative for abdominal pain, blood in stool, constipation, diarrhea, melena, nausea and vomiting.  Genitourinary: Negative for dysuria and hematuria.  Musculoskeletal: Negative for joint pain and myalgias.  Skin: Negative for itching and rash.  Neurological: Negative for dizziness, loss of consciousness, weakness and headaches.  Endo/Heme/Allergies: Does not bruise/bleed easily.  Psychiatric/Behavioral: Negative.     Objective:  Physical Exam Constitutional:      General: He is not in acute distress.    Appearance: Normal appearance. He is normal weight. He is not ill-appearing.  HENT:     Head: Normocephalic and atraumatic.     Nose: Nose normal.     Mouth/Throat:     Mouth: Mucous membranes are moist.     Pharynx: Oropharynx is clear.  Eyes:     General: No scleral icterus.    Extraocular Movements: Extraocular movements intact.     Conjunctiva/sclera: Conjunctivae normal.     Pupils: Pupils are equal, round, and reactive to light.  Cardiovascular:     Rate and Rhythm: Normal rate and regular rhythm.     Pulses: Normal pulses.     Heart sounds: Normal heart sounds. No murmur heard.   Pulmonary:     Effort: Pulmonary effort is normal.     Breath sounds: Normal breath sounds. No wheezing, rhonchi or rales.  Abdominal:     General: Abdomen is flat. Bowel sounds are normal. There is no distension.     Palpations: Abdomen is soft.     Tenderness: There is no abdominal tenderness.  Musculoskeletal:     Right lower leg: No edema.     Left lower leg: No edema.  Skin:    General: Skin is warm and dry.  Neurological:     General: No focal deficit present.     Mental Status: He is alert and oriented to person, place, and time. Mental status is at baseline.  Psychiatric:        Mood and Affect: Mood normal.        Behavior: Behavior  normal.        Thought Content: Thought content normal.        Judgment: Judgment normal.     Vitals:   05/06/20 0915  BP: 122/76  Pulse: 77  Temp: 98.3 F (36.8 C)  TempSrc: Oral  SpO2: 97%  Weight: 215 lb 9.6 oz (97.8 kg)  Height: 5\' 10"  (1.778 m)    CBC    Component Value Date/Time   WBC 5.1 04/04/2019 1729   WBC 6.7 03/05/2018 0703   RBC 4.95 04/04/2019 1729   RBC 4.61 03/05/2018 0703   HGB 15.7 04/04/2019  1729   HCT 44.2 04/04/2019 1729   PLT 207 04/04/2019 1729   MCV 89 04/04/2019 1729   MCH 31.7 04/04/2019 1729   MCH 31.0 03/05/2018 0703   MCHC 35.5 04/04/2019 1729   MCHC 35.5 03/05/2018 0703   RDW 11.8 04/04/2019 1729   Chest imaging: CXR 03/17/20 reviewed; no opacities, nodules or masses noted. No pleural effusions.  PFT: None on file  Labs: CBC 03/17/20: WBC 6.9, HGB 15.1, HCT 43, Plt 229  Assessment & Plan:   Hemoptysis - Plan: CT Chest W Contrast  Discussion: Wesley Mcknight is a 39 year old male, non-smoker with hemoptysis. The etiology of his hemoptysis is unknown at this time. Chest radiograph does not indicate any nodule, mass or opacities. He does not appear to have an infectious component at this time. There is concern for an aberrant vessel that may become stressed during periods of strenuous cardiopulmonary exercise. We will further evaluate his hemoptysis with a CT chest scan with contrast. We discussed the possibility of pursuing a bronchoscopy after the CT scan which he understood.   Freda Jackson, MD [facility] Office: [phone]   Current Outpatient Medications:  Marland Kitchen  Multiple Vitamins-Minerals (CENTRUM ADULTS) TABS, Take by mouth daily., Disp: , Rfl:  .  omeprazole (PRILOSEC) 20 MG capsule, Take 1 capsule (20 mg total) by mouth daily. (Patient not taking: Reported on 05/06/2020), Disp: 30 capsule, Rfl: 1

## 2020-05-09 ENCOUNTER — Ambulatory Visit (INDEPENDENT_AMBULATORY_CARE_PROVIDER_SITE_OTHER)
Admission: RE | Admit: 2020-05-09 | Discharge: 2020-05-09 | Disposition: A | Discharge status: Home / Self Care | Payer: BC Managed Care – PPO | Source: Ambulatory Visit | Attending: Pulmonary Disease | Admitting: Pulmonary Disease

## 2020-05-09 ENCOUNTER — Other Ambulatory Visit: Payer: Self-pay

## 2020-05-09 DIAGNOSIS — R042 Hemoptysis: Secondary | ICD-10-CM

## 2020-05-09 MED ORDER — IOHEXOL 300 MG/ML  SOLN
80.0000 mL | Freq: Once | INTRAMUSCULAR | Status: AC | PRN
  Administered 2020-05-09: 80 mL via INTRAVENOUS

## 2020-05-13 ENCOUNTER — Telehealth: Payer: Self-pay | Admitting: Pulmonary Disease

## 2020-05-13 NOTE — Telephone Encounter (Signed)
Informed the patient of his CT results that showed areas of micronodular opacities in the right upper lobe and right lower lobe and that these are likely related to an infection or an inflammatory disorder. I have recommended that we perform a bronchoscopy with BAL to send cultures to further evaluate an infectious cause. He expressed understanding and will be calling the office back once he knows his schedule to notify us which dates he is available for the procedure.

## 2020-05-14 ENCOUNTER — Telehealth: Payer: Self-pay | Admitting: Pulmonary Disease

## 2020-05-14 DIAGNOSIS — R042 Hemoptysis: Secondary | ICD-10-CM

## 2020-05-14 NOTE — Telephone Encounter (Signed)
Spoke with the pt  He wants to proceed with scheduling bronch  He is available Oct 6, 11, 18, 19 and 20th  Please advise thanks

## 2020-05-19 NOTE — Telephone Encounter (Signed)
Please schedule the following:  Diagnosis: Hemoptysis Procedure: Bronchoscopy with BAL Anesthesia: Yes Do you need Fluro? no Priority: routine Date: 05/26/20 Alternate Date:  Time: 2 PM Location: [facility] Does patient have OSA? No DM? No Or Latex allergy? No Medication Restriction: No Anticoagulate/Antiplatelet: No Pre-op Labs Ordered: CBC, CMP, PT/INR, PTT Imaging request: None Equipment request: Pediatric Scope and diagnostic scope  Please coordinate Pre-op COVID Testing

## 2020-05-20 NOTE — Telephone Encounter (Signed)
Called and spoke with patient. Let them know their Bronch is scheduled for 05/26/20  with Dr. Erin Fulling at [facility] at 2pm.  Patient was instructed to arrive at hospital at 1pm. They were instructed to bring someone with them as they will not be able to drive home from procedure. Patient instructed not to have anything to eat or drink after midnight.  Patient's covid screening is scheduled at [facility] for 05/23/20 at 2pm Patient told to come to office for lab draws this week..  Patient voiced understanding, nothing further needed  Routing to [place] as [place]

## 2020-05-20 NOTE — Telephone Encounter (Signed)
Please see above

## 2020-05-21 ENCOUNTER — Encounter (HOSPITAL_COMMUNITY): Payer: Self-pay | Admitting: Pulmonary Disease

## 2020-05-21 NOTE — Progress Notes (Signed)
Attempted to obtain medical history via telephone, unable to reach at this time. I left a voicemail to return pre surgical testing department's phone call.  

## 2020-05-22 ENCOUNTER — Other Ambulatory Visit (INDEPENDENT_AMBULATORY_CARE_PROVIDER_SITE_OTHER): Payer: BC Managed Care – PPO

## 2020-05-22 DIAGNOSIS — R042 Hemoptysis: Secondary | ICD-10-CM

## 2020-05-22 LAB — CBC WITH DIFFERENTIAL/PLATELET
Basophils Absolute: 0 10*3/uL (ref 0.0–0.1)
Basophils Relative: 0.4 % (ref 0.0–3.0)
Eosinophils Absolute: 0.1 10*3/uL (ref 0.0–0.7)
Eosinophils Relative: 1.3 % (ref 0.0–5.0)
HCT: 43.5 % (ref 39.0–52.0)
Hemoglobin: 15 g/dL (ref 13.0–17.0)
Lymphocytes Relative: 22.8 % (ref 12.0–46.0)
Lymphs Abs: 1.1 10*3/uL (ref 0.7–4.0)
MCHC: 34.5 g/dL (ref 30.0–36.0)
MCV: 90.9 fl (ref 78.0–100.0)
Monocytes Absolute: 0.4 10*3/uL (ref 0.1–1.0)
Monocytes Relative: 8.1 % (ref 3.0–12.0)
Neutro Abs: 3.2 10*3/uL (ref 1.4–7.7)
Neutrophils Relative %: 67.4 % (ref 43.0–77.0)
Platelets: 225 10*3/uL (ref 150.0–400.0)
RBC: 4.79 Mil/uL (ref 4.22–5.81)
RDW: 12.4 % (ref 11.5–15.5)
WBC: 4.8 10*3/uL (ref 4.0–10.5)

## 2020-05-22 LAB — COMPREHENSIVE METABOLIC PANEL
ALT: 22 U/L (ref 0–53)
AST: 19 U/L (ref 0–37)
Albumin: 4.5 g/dL (ref 3.5–5.2)
Alkaline Phosphatase: 71 U/L (ref 39–117)
BUN: 15 mg/dL (ref 6–23)
CO2: 32 mEq/L (ref 19–32)
Calcium: 9.4 mg/dL (ref 8.4–10.5)
Chloride: 104 mEq/L (ref 96–112)
Creatinine, Ser: 1.05 mg/dL (ref 0.40–1.50)
GFR: 88.76 mL/min (ref >60.00)
Glucose, Bld: 86 mg/dL (ref 70–99)
Potassium: 4.2 mEq/L (ref 3.5–5.1)
Sodium: 140 mEq/L (ref 135–145)
Total Bilirubin: 1 mg/dL (ref 0.2–1.2)
Total Protein: 7.2 g/dL (ref 6.0–8.3)

## 2020-05-22 LAB — PROTIME-INR
INR: 1 ratio (ref 0.8–1.0)
Prothrombin Time: 11.5 s (ref 9.6–13.1)

## 2020-05-22 LAB — APTT: aPTT: 34.7 s — ABNORMAL HIGH (ref 23.4–32.7)

## 2020-05-23 ENCOUNTER — Other Ambulatory Visit (HOSPITAL_COMMUNITY)
Admission: RE | Admit: 2020-05-23 | Discharge: 2020-05-23 | Disposition: A | Discharge status: Home / Self Care | Payer: BC Managed Care – PPO | Source: Ambulatory Visit | Attending: Pulmonary Disease | Admitting: Pulmonary Disease

## 2020-05-23 DIAGNOSIS — Z20822 Contact with and (suspected) exposure to covid-19: Secondary | ICD-10-CM | POA: Insufficient documentation

## 2020-05-23 DIAGNOSIS — Z01812 Encounter for preprocedural laboratory examination: Secondary | ICD-10-CM | POA: Insufficient documentation

## 2020-05-23 LAB — SARS CORONAVIRUS 2 (TAT 6-24 HRS): SARS Coronavirus 2: NEGATIVE

## 2020-05-26 ENCOUNTER — Encounter (HOSPITAL_COMMUNITY)
Admission: RE | Disposition: A | Discharge status: Home / Self Care | Payer: Self-pay | Source: Home / Self Care | Attending: Pulmonary Disease

## 2020-05-26 ENCOUNTER — Ambulatory Visit (HOSPITAL_COMMUNITY)
Admission: RE | Admit: 2020-05-26 | Discharge: 2020-05-26 | Disposition: A | Discharge status: Home / Self Care | Payer: BC Managed Care – PPO | Attending: Pulmonary Disease | Admitting: Pulmonary Disease

## 2020-05-26 ENCOUNTER — Other Ambulatory Visit: Payer: Self-pay

## 2020-05-26 ENCOUNTER — Ambulatory Visit (HOSPITAL_COMMUNITY): Payer: BC Managed Care – PPO | Admitting: Anesthesiology

## 2020-05-26 ENCOUNTER — Encounter (HOSPITAL_COMMUNITY): Payer: Self-pay | Admitting: Pulmonary Disease

## 2020-05-26 DIAGNOSIS — R042 Hemoptysis: Secondary | ICD-10-CM | POA: Insufficient documentation

## 2020-05-26 DIAGNOSIS — Z885 Allergy status to narcotic agent status: Secondary | ICD-10-CM | POA: Insufficient documentation

## 2020-05-26 DIAGNOSIS — Z87891 Personal history of nicotine dependence: Secondary | ICD-10-CM | POA: Diagnosis not present

## 2020-05-26 HISTORY — DX: Hemoptysis: R04.2

## 2020-05-26 HISTORY — PX: BRONCHIAL WASHINGS: SHX5105

## 2020-05-26 HISTORY — PX: VIDEO BRONCHOSCOPY: SHX5072

## 2020-05-26 LAB — BODY FLUID CELL COUNT WITH DIFFERENTIAL
Lymphs, Fluid: 48 %
Lymphs, Fluid: 55 %
Monocyte-Macrophage-Serous Fluid: 27 % — ABNORMAL LOW (ref 50–90)
Monocyte-Macrophage-Serous Fluid: 39 % — ABNORMAL LOW (ref 50–90)
Neutrophil Count, Fluid: 1 % (ref 0–25)
Neutrophil Count, Fluid: 3 % (ref 0–25)
Other Cells, Fluid: 10 %
Other Cells, Fluid: 17 %
Total Nucleated Cell Count, Fluid: 55 uL (ref 0–1000)
Total Nucleated Cell Count, Fluid: 66 cu mm (ref 0–1000)

## 2020-05-26 SURGERY — BRONCHOSCOPY, VIDEO-ASSISTED
Anesthesia: General

## 2020-05-26 MED ORDER — FENTANYL CITRATE (PF) 100 MCG/2ML IJ SOLN
INTRAMUSCULAR | Status: DC | PRN
  Administered 2020-05-26: 100 ug via INTRAVENOUS

## 2020-05-26 MED ORDER — MIDAZOLAM HCL 2 MG/2ML IJ SOLN
INTRAMUSCULAR | Status: DC | PRN
  Administered 2020-05-26: 2 mg via INTRAVENOUS

## 2020-05-26 MED ORDER — LACTATED RINGERS IV SOLN: INTRAVENOUS | Status: DC

## 2020-05-26 MED ORDER — MIDAZOLAM HCL 2 MG/2ML IJ SOLN
INTRAMUSCULAR | Status: AC
  Filled 2020-05-26: qty 2

## 2020-05-26 MED ORDER — PROPOFOL 10 MG/ML IV BOLUS
INTRAVENOUS | Status: AC
  Filled 2020-05-26: qty 20

## 2020-05-26 MED ORDER — PROPOFOL 10 MG/ML IV BOLUS
INTRAVENOUS | Status: DC | PRN
  Administered 2020-05-26: 200 mg via INTRAVENOUS

## 2020-05-26 MED ORDER — SUGAMMADEX SODIUM 200 MG/2ML IV SOLN
INTRAVENOUS | Status: DC | PRN
  Administered 2020-05-26: 250 mg via INTRAVENOUS

## 2020-05-26 MED ORDER — DEXAMETHASONE SODIUM PHOSPHATE 10 MG/ML IJ SOLN
INTRAMUSCULAR | Status: DC | PRN
  Administered 2020-05-26: 10 mg via INTRAVENOUS

## 2020-05-26 MED ORDER — ONDANSETRON HCL 4 MG/2ML IJ SOLN
INTRAMUSCULAR | Status: DC | PRN
  Administered 2020-05-26: 4 mg via INTRAVENOUS

## 2020-05-26 MED ORDER — ROCURONIUM BROMIDE 10 MG/ML (PF) SYRINGE
PREFILLED_SYRINGE | INTRAVENOUS | Status: DC | PRN
  Administered 2020-05-26: 50 mg via INTRAVENOUS

## 2020-05-26 MED ORDER — LIDOCAINE 2% (20 MG/ML) 5 ML SYRINGE
INTRAMUSCULAR | Status: DC | PRN
  Administered 2020-05-26: 100 mg via INTRAVENOUS

## 2020-05-26 MED ORDER — FENTANYL CITRATE (PF) 100 MCG/2ML IJ SOLN
INTRAMUSCULAR | Status: AC
  Filled 2020-05-26: qty 2

## 2020-05-26 MED ORDER — LIDOCAINE HCL 1 % IJ SOLN
INTRAMUSCULAR | Status: AC
  Filled 2020-05-26: qty 1

## 2020-05-26 NOTE — Op Note (Signed)
Bronchoscopy Procedure Note  Wesley Mcknight  [id number]  1980-09-22  Date:05/26/20  Time:2:38 PM   Provider Performing:Lorane Cousar B Lajuan Godbee   Procedure(s):  Flexible bronchoscopy with bronchial alveolar lavage (19166)  Indication(s) Hemoptysis  Consent Risks of the procedure as well as the alternatives and risks of each were explained to the patient and/or caregiver.  Consent for the procedure was obtained and is signed in the bedside chart  Anesthesia General   Time Out Verified patient identification, verified procedure, site/side was marked, verified correct patient position, special equipment/implants available, medications/allergies/relevant history reviewed, required imaging and test results available.   Sterile Technique Usual hand hygiene, masks, gowns, and gloves were used   Procedure Description Bronchoscope advanced through endotracheal tube and into airway.  Airways were examined down to subsegmental level with findings noted below.   Following diagnostic evaluation, BAL(s) performed in right upper lobe (anterior segment) and right lower lobe (superior segment) with normal saline and return of 51m and 260mfluid respectively.  Findings:  Normal airway exam. No areas of bloody secretions noted.    Complications/Tolerance None; patient tolerated the procedure well. Chest X-ray is not needed post procedure.   EBL Minimal   Specimen(s) BAL from [facility] sent for AFB, fungal and bacterial cultures. Cell count and cytology. BAL from [facility] sent for AFB, fungal and bacterial cultures. Cell count and cytology.

## 2020-05-26 NOTE — Anesthesia Procedure Notes (Signed)
Procedure Name: Intubation Date/Time: 05/26/2020 2:02 PM Performed by: Sharlette Dense, CRNA Patient Re-evaluated:Patient Re-evaluated prior to induction Oxygen Delivery Method: Circle system utilized Preoxygenation: Pre-oxygenation with 100% oxygen Induction Type: IV induction Ventilation: Mask ventilation without difficulty and Oral airway inserted - appropriate to patient size Laryngoscope Size: Miller and 3 Grade View: Grade I Tube type: Oral Tube size: 9.0 mm Number of attempts: 1 Airway Equipment and Method: Stylet Placement Confirmation: ETT inserted through vocal cords under direct vision,  positive ETCO2 and breath sounds checked- equal and bilateral Secured at: 22 cm Tube secured with: Tape Dental Injury: Teeth and Oropharynx as per pre-operative assessment

## 2020-05-26 NOTE — Transfer of Care (Signed)
Immediate Anesthesia Transfer of Care Note  Patient: Wesley Mcknight  Procedure(s) Performed: VIDEO BRONCHOSCOPY  Patient Location: Endoscopy Unit  Anesthesia Type:General  Level of Consciousness: awake  Airway & Oxygen Therapy: Patient Spontanous Breathing and Patient connected to face mask oxygen  Post-op Assessment: Report given to RN and Post -op Vital signs reviewed and stable  Post vital signs: Reviewed and stable  Last Vitals:  Vitals Value Taken Time  BP    Temp    Pulse    Resp    SpO2      Last Pain:  Vitals:   05/26/20 1321  TempSrc: Oral  PainSc: 0-No pain         Complications: No complications documented.

## 2020-05-26 NOTE — Discharge Instructions (Signed)
Flexible Bronchoscopy, Care After This sheet gives you information about how to care for yourself after your test. Your doctor may also give you more specific instructions. If you have problems or questions, contact your doctor. Follow these instructions at home: Eating and drinking  Do not eat or drink anything (not even water) for 2 hours after your test, or until your numbing medicine (local anesthetic) wears off.  When your numbness is gone and your cough and gag reflexes have come back, you may: ? Eat only soft foods. ? Slowly drink liquids.  The day after the test, go back to your normal diet. Driving  Do not drive for 24 hours if you were given a medicine to help you relax (sedative).  Do not drive or use heavy machinery while taking prescription pain medicine. General instructions   Take over-the-counter and prescription medicines only as told by your doctor.  Return to your normal activities as told. Ask what activities are safe for you.  Do not use any products that have nicotine or tobacco in them. This includes cigarettes and e-cigarettes. If you need help quitting, ask your doctor.  Keep all follow-up visits as told by your doctor. This is important. It is very important if you had a tissue sample (biopsy) taken. Get help right away if:  You have shortness of breath that gets worse.  You get light-headed.  You feel like you are going to pass out (faint).  You have chest pain.  You cough up: ? More than a little blood. ? More blood than before. Summary  Do not eat or drink anything (not even water) for 2 hours after your test, or until your numbing medicine wears off.  Do not use cigarettes. Do not use e-cigarettes.  Get help right away if you have chest pain. This information is not intended to replace advice given to you by your health care provider. Make sure you discuss any questions you have with your health care provider. Document Revised:  07/15/2017 Document Reviewed: 08/20/2016 Elsevier Patient Education  [place]. We Will call you with the culture results next week. Some cultures can take up to 6 weeks to return.   Flexible Bronchoscopy, Care After This sheet gives you information about how to care for yourself after your test. Your doctor may also give you more specific instructions. If you have problems or questions, contact your doctor. Follow these instructions at home: Eating and drinking  Do not eat or drink anything (not even water) for 2 hours after your test, or until your numbing medicine (local anesthetic) wears off.  When your numbness is gone and your cough and gag reflexes have come back, you may: ? Eat only soft foods. ? Slowly drink liquids.  The day after the test, go back to your normal diet. Driving  Do not drive for 24 hours if you were given a medicine to help you relax (sedative).  Do not drive or use heavy machinery while taking prescription pain medicine. General instructions   Take over-the-counter and prescription medicines only as told by your doctor.  Return to your normal activities as told. Ask what activities are safe for you.  Do not use any products that have nicotine or tobacco in them. This includes cigarettes and e-cigarettes. If you need help quitting, ask your doctor.  Keep all follow-up visits as told by your doctor. This is important. It is very important if you had a tissue sample (biopsy) taken. Get help  right away if:  You have shortness of breath that gets worse.  You get light-headed.  You feel like you are going to pass out (faint).  You have chest pain.  You cough up: ? More than a little blood. ? More blood than before. Summary  Do not eat or drink anything (not even water) for 2 hours after your test, or until your numbing medicine wears off.  Do not use cigarettes. Do not use e-cigarettes.  Get help right away if you have chest pain. This  information is not intended to replace advice given to you by your health care provider. Make sure you discuss any questions you have with your health care provider. Document Revised: 07/15/2017 Document Reviewed: 08/20/2016 Elsevier Patient Education  2020 [employer].

## 2020-05-26 NOTE — Interval H&P Note (Signed)
History and Physical Interval Note:  05/26/2020 1:02 PM  Wesley Mcknight  has presented today for surgery, with the diagnosis of HEMOPTYSIS.  The various methods of treatment have been discussed with the patient and family. After consideration of risks, benefits and other options for treatment, the patient has consented to  Procedure(s): VIDEO BRONCHOSCOPY WITH FLUORO (N/A) as a surgical intervention.  The patient's history has been reviewed, patient examined, no change in status, stable for surgery.  I have reviewed the patient's chart and labs.  Questions were answered to the patient's satisfaction.     [name]

## 2020-05-26 NOTE — Anesthesia Preprocedure Evaluation (Addendum)
Anesthesia Evaluation  Patient identified by MRN, date of birth, ID band Patient awake    Reviewed: Allergy & Precautions, NPO status , Patient's Chart, lab work & pertinent test results  Airway Mallampati: II  TM Distance: >3 FB Neck ROM: Full    Dental  (+) Teeth Intact, Dental Advisory Given   Pulmonary neg pulmonary ROS,    breath sounds clear to auscultation       Cardiovascular negative cardio ROS   Rhythm:Regular Rate:Normal     Neuro/Psych negative neurological ROS  negative psych ROS   GI/Hepatic negative GI ROS, Neg liver ROS,   Endo/Other  negative endocrine ROS  Renal/GU negative Renal ROS     Musculoskeletal negative musculoskeletal ROS (+)   Abdominal Normal abdominal exam  (+)   Peds  Hematology negative hematology ROS (+)   Anesthesia Other Findings   Reproductive/Obstetrics                            Anesthesia Physical Anesthesia Plan  ASA: II  Anesthesia Plan: General   Post-op Pain Management:    Induction: Intravenous  PONV Risk Score and Plan: 2 and Ondansetron and Midazolam  Airway Management Planned: Oral ETT  Additional Equipment: None  Intra-op Plan:   Post-operative Plan: Extubation in OR  Informed Consent: I have reviewed the patients History and Physical, chart, labs and discussed the procedure including the risks, benefits and alternatives for the proposed anesthesia with the patient or authorized representative who has indicated his/her understanding and acceptance.     Dental advisory given  Plan Discussed with: CRNA  Anesthesia Plan Comments:        Anesthesia Quick Evaluation

## 2020-05-27 NOTE — Anesthesia Postprocedure Evaluation (Signed)
Anesthesia Post Note  Patient: Wesley Mcknight  Procedure(s) Performed: VIDEO BRONCHOSCOPY     Patient location during evaluation: PACU Anesthesia Type: General Level of consciousness: awake and alert Pain management: pain level controlled Vital Signs Assessment: post-procedure vital signs reviewed and stable Respiratory status: spontaneous breathing, nonlabored ventilation, respiratory function stable and patient connected to nasal cannula oxygen Cardiovascular status: blood pressure returned to baseline and stable Postop Assessment: no apparent nausea or vomiting Anesthetic complications: no   No complications documented.               Effie Berkshire

## 2020-05-28 ENCOUNTER — Encounter (HOSPITAL_COMMUNITY): Payer: Self-pay | Admitting: Pulmonary Disease

## 2020-05-28 LAB — ACID FAST SMEAR (AFB, MYCOBACTERIA)
Acid Fast Smear: NEGATIVE
Acid Fast Smear: NEGATIVE

## 2020-05-28 LAB — CYTOLOGY - NON PAP

## 2020-05-29 LAB — CULTURE, RESPIRATORY W GRAM STAIN
Culture: NO GROWTH
Culture: NO GROWTH
Gram Stain: NONE SEEN

## 2020-06-03 ENCOUNTER — Telehealth: Payer: Self-pay | Admitting: Pulmonary Disease

## 2020-06-03 DIAGNOSIS — R791 Abnormal coagulation profile: Secondary | ICD-10-CM

## 2020-06-03 NOTE — Telephone Encounter (Signed)
Spoke with patient about his bronchoscopy results and the elevated aPTT test. He does report history of easing bruising. We will repeat his aPTT test along with von Willebrand Factor screening. Orders have been placed and patient will be in sometime this week to have his blood drawn.

## 2020-06-04 ENCOUNTER — Other Ambulatory Visit (INDEPENDENT_AMBULATORY_CARE_PROVIDER_SITE_OTHER): Payer: BC Managed Care – PPO

## 2020-06-04 DIAGNOSIS — R791 Abnormal coagulation profile: Secondary | ICD-10-CM

## 2020-06-05 LAB — APTT: aPTT: 31.6 s (ref 23.4–32.7)

## 2020-06-24 LAB — VON WILLEBRAND FACTOR SCREEN
APTT: 29.2 s
Factor VIII Activity: 111 %
von Willebrand Factor Activity: 127 %
von Willebrand Factor Antigen: 119 %

## 2020-06-26 LAB — FUNGUS CULTURE WITH STAIN

## 2020-06-26 LAB — FUNGUS CULTURE RESULT

## 2020-06-26 LAB — FUNGAL ORGANISM REFLEX

## 2020-07-03 ENCOUNTER — Telehealth: Payer: Self-pay | Admitting: Pulmonary Disease

## 2020-07-03 NOTE — Telephone Encounter (Signed)
Please let the patient know that his PTT test was normal on the recheck and the Von Willebrand factor test was negative. His bleeding studies are normal at this time.   Thanks, Wille Glaser

## 2020-07-03 NOTE — Telephone Encounter (Signed)
Spoke with the pt  He is asking about labs that were done on 06/04/20  Please advise, thanks!

## 2020-07-03 NOTE — Telephone Encounter (Signed)
Spoke with the pt and notified of results per Dr Erin Fulling. He verbalized understanding and nothing further needed.

## 2020-07-11 LAB — ACID FAST CULTURE WITH REFLEXED SENSITIVITIES (MYCOBACTERIA)
Acid Fast Culture: NEGATIVE
Acid Fast Culture: NEGATIVE

## 2021-12-22 ENCOUNTER — Ambulatory Visit: Payer: BC Managed Care – PPO | Admitting: Dermatology

## 2022-02-02 ENCOUNTER — Encounter: Payer: Self-pay | Admitting: Dermatology

## 2022-02-02 ENCOUNTER — Ambulatory Visit (INDEPENDENT_AMBULATORY_CARE_PROVIDER_SITE_OTHER): Payer: BC Managed Care – PPO | Admitting: Dermatology

## 2022-02-02 DIAGNOSIS — D224 Melanocytic nevi of scalp and neck: Secondary | ICD-10-CM

## 2022-02-02 DIAGNOSIS — D485 Neoplasm of uncertain behavior of skin: Secondary | ICD-10-CM

## 2022-02-02 DIAGNOSIS — L905 Scar conditions and fibrosis of skin: Secondary | ICD-10-CM | POA: Diagnosis not present

## 2022-02-02 DIAGNOSIS — Z1283 Encounter for screening for malignant neoplasm of skin: Secondary | ICD-10-CM

## 2022-02-02 NOTE — Patient Instructions (Addendum)
Finger lesion  [name]. [name], MD - Ortho Surgeon [name] [email or web address] > providers > [email or web address]... Dr. Amedeo Plenty is an orthopedic surgeon in [place] specializing in the hand, wrist & elbow, treating trauma & chronic conditions such as arthritis.  Biopsy, Surgery (Curettage) & Surgery (Excision) Aftercare Instructions  1. Okay to remove bandage in 24 hours  2. Wash area with soap and water  3. Apply Vaseline to area twice daily until healed (Not Neosporin)  4. Okay to cover with a Band-Aid to decrease the chance of infection or prevent irritation from clothing; also it's okay to uncover lesion at home.  5. Suture instructions: return to our office in 7-10 or 10-14 days for a nurse visit for suture removal. Variable healing with sutures, if pain or itching occurs call our office. It's okay to shower or bathe 24 hours after sutures are given.  6. The following risks may occur after a biopsy, curettage or excision: bleeding, scarring, discoloration, recurrence, infection (redness, yellow drainage, pain or swelling).  7. For questions, concerns and results call our office at [place] before 4pm & Friday before 3pm. Biopsy results will be available in 1 week.

## 2022-02-28 ENCOUNTER — Encounter: Payer: Self-pay | Admitting: Dermatology

## 2022-02-28 NOTE — Progress Notes (Signed)
   New Patient   Subjective  Wesley Mcknight is a 41 y.o. male who presents for the following: New Patient (Initial Visit) (Patient here today for lesion on his scalp x years, per patient bleeding with scratching. Check lesion on his right middle finger x 1 year no pain, no bleeding. ).  General skin check, new spot on left hand spot that has bled on scalp Location:  Duration:  Quality:  Associated Signs/Symptoms: Modifying Factors:  Severity:  Timing: Context:    The following portions of the chart were reviewed this encounter and updated as appropriate:      Objective  Well appearing patient in no apparent distress; mood and affect are within normal limits. No atypical nevi or signs of NMSC noted at the time of the visit. SGHP on the left forehead and angioma on the face.  Small synovial cyst on hand will be referred to hand surgery.  Right Occipital Scalp 6 mm flesh-colored papule, focal hemorrhage with dermoscopy; 8.5 cm to upper ear junction        A full examination was performed including scalp, head, eyes, ears, nose, lips, neck, chest, axillae, abdomen, back, buttocks, bilateral upper extremities, bilateral lower extremities, hands, feet, fingers, toes, fingernails, and toenails. All findings within normal limits unless otherwise noted below.   Assessment & Plan  Screening exam for skin cancer  Hand lesion gang. cyst [name]. [name], MD - Ortho Surgeon [name] [email or web address] > providers > [email or web address]... Dr. Amedeo Plenty is an orthopedic surgeon in [place] specializing in the hand, wrist & elbow, treating trauma & chronic conditions such as arthritis.  Neoplasm of uncertain behavior of skin Right Occipital Scalp  Skin / nail biopsy Type of biopsy: tangential   Informed consent: discussed and consent obtained   Timeout: patient name, date of birth, surgical site, and procedure verified   Anesthesia: the lesion was  anesthetized in a standard fashion   Anesthetic:  1% lidocaine w/ epinephrine 1-100,000 local infiltration Instrument used: flexible razor blade   Hemostasis achieved with: aluminum chloride and electrodesiccation   Outcome: patient tolerated procedure well   Post-procedure details: wound care instructions given    Specimen 1 - Surgical pathology Differential Diagnosis: nevus cautery only  Check Margins: No
